# Patient Record
Sex: Female | Born: 1980 | Race: White | Hispanic: No | Marital: Married | Smoking: Never smoker
Health system: Southern US, Community
[De-identification: ages and names within clinical notes are randomized; demographics above are authoritative.]

## PROBLEM LIST (undated history)

## (undated) DIAGNOSIS — Z789 Other specified health status: Secondary | ICD-10-CM

## (undated) DIAGNOSIS — M1990 Unspecified osteoarthritis, unspecified site: Secondary | ICD-10-CM

## (undated) DIAGNOSIS — O42913 Preterm premature rupture of membranes, unspecified as to length of time between rupture and onset of labor, third trimester: Secondary | ICD-10-CM

## (undated) DIAGNOSIS — D62 Acute posthemorrhagic anemia: Secondary | ICD-10-CM

## (undated) HISTORY — DX: Unspecified osteoarthritis, unspecified site: M19.90

## (undated) SURGERY — Surgical Case
Anesthesia: Regional

---

## 2010-08-18 ENCOUNTER — Ambulatory Visit (HOSPITAL_COMMUNITY)
Admission: RE | Admit: 2010-08-18 | Discharge: 2010-08-18 | Payer: Self-pay | Source: Home / Self Care | Attending: Obstetrics | Admitting: Obstetrics

## 2010-10-06 LAB — HEPATITIS B SURFACE ANTIGEN: Hepatitis B Surface Ag: NEGATIVE

## 2010-10-06 LAB — HIV ANTIBODY (ROUTINE TESTING W REFLEX): HIV: NONREACTIVE

## 2010-10-14 LAB — GC/CHLAMYDIA PROBE AMP, GENITAL: Gonorrhea: NEGATIVE

## 2010-10-19 ENCOUNTER — Ambulatory Visit (HOSPITAL_COMMUNITY): Payer: Managed Care, Other (non HMO)

## 2011-02-24 LAB — RPR: RPR: NONREACTIVE

## 2011-04-11 DIAGNOSIS — Z09 Encounter for follow-up examination after completed treatment for conditions other than malignant neoplasm: Secondary | ICD-10-CM

## 2011-05-12 ENCOUNTER — Inpatient Hospital Stay (HOSPITAL_COMMUNITY)
Admission: AD | Admit: 2011-05-12 | Discharge: 2011-05-16 | DRG: 765 | Disposition: A | Discharge status: Home / Self Care | Payer: Managed Care, Other (non HMO) | Source: Ambulatory Visit | Attending: Obstetrics & Gynecology | Admitting: Obstetrics & Gynecology

## 2011-05-12 ENCOUNTER — Encounter (HOSPITAL_COMMUNITY): Payer: Self-pay | Admitting: *Deleted

## 2011-05-12 DIAGNOSIS — O9903 Anemia complicating the puerperium: Secondary | ICD-10-CM | POA: Diagnosis not present

## 2011-05-12 DIAGNOSIS — O324XX0 Maternal care for high head at term, not applicable or unspecified: Secondary | ICD-10-CM | POA: Diagnosis present

## 2011-05-12 DIAGNOSIS — D62 Acute posthemorrhagic anemia: Secondary | ICD-10-CM | POA: Diagnosis not present

## 2011-05-12 DIAGNOSIS — IMO0001 Reserved for inherently not codable concepts without codable children: Secondary | ICD-10-CM

## 2011-05-12 HISTORY — DX: Acute posthemorrhagic anemia: D62

## 2011-05-12 HISTORY — DX: Other specified health status: Z78.9

## 2011-05-12 NOTE — Progress Notes (Signed)
Pt states," I've had contractions off and on all day, but regular since 5:30 pm. I was 3.5 cm in the office yesterday and Dr. Prudencio Pair stripped my membranes. Now they are every 3-5 min."

## 2011-05-13 ENCOUNTER — Encounter (HOSPITAL_COMMUNITY): Payer: Self-pay | Admitting: Anesthesiology

## 2011-05-13 ENCOUNTER — Encounter (HOSPITAL_COMMUNITY)
Admission: AD | Disposition: A | Discharge status: Home / Self Care | Payer: Self-pay | Source: Ambulatory Visit | Attending: Obstetrics & Gynecology

## 2011-05-13 ENCOUNTER — Encounter (HOSPITAL_COMMUNITY): Payer: Self-pay | Admitting: Obstetrics & Gynecology

## 2011-05-13 ENCOUNTER — Inpatient Hospital Stay (HOSPITAL_COMMUNITY): Payer: Managed Care, Other (non HMO) | Admitting: Anesthesiology

## 2011-05-13 LAB — CBC
HCT: 33.8 % — ABNORMAL LOW (ref 36.0–46.0)
Hemoglobin: 11.6 g/dL — ABNORMAL LOW (ref 12.0–15.0)
MCH: 31.7 pg (ref 26.0–34.0)
MCHC: 34.3 g/dL (ref 30.0–36.0)
MCV: 92.3 fL (ref 78.0–100.0)
Platelets: 140 10*3/uL — ABNORMAL LOW (ref 150–400)
RBC: 3.66 MIL/uL — ABNORMAL LOW (ref 3.87–5.11)
RDW: 13.2 % (ref 11.5–15.5)
WBC: 20.8 10*3/uL — ABNORMAL HIGH (ref 4.0–10.5)

## 2011-05-13 SURGERY — Surgical Case
Anesthesia: Regional | Site: Abdomen | Wound class: Clean Contaminated

## 2011-05-13 MED ORDER — DIPHENHYDRAMINE HCL 25 MG PO CAPS: 25.0000 mg | ORAL_CAPSULE | Freq: Four times a day (QID) | ORAL | Status: DC | PRN

## 2011-05-13 MED ORDER — EPHEDRINE 5 MG/ML INJ
10.0000 mg | INTRAVENOUS | Status: DC | PRN
  Filled 2011-05-13: qty 4

## 2011-05-13 MED ORDER — ONDANSETRON HCL 4 MG/2ML IJ SOLN: 4.0000 mg | Freq: Three times a day (TID) | INTRAMUSCULAR | Status: DC | PRN

## 2011-05-13 MED ORDER — PHENYLEPHRINE 40 MCG/ML (10ML) SYRINGE FOR IV PUSH (FOR BLOOD PRESSURE SUPPORT)
PREFILLED_SYRINGE | INTRAVENOUS | Status: AC
  Filled 2011-05-13: qty 5

## 2011-05-13 MED ORDER — METOCLOPRAMIDE HCL 5 MG/ML IJ SOLN: 10.0000 mg | Freq: Once | INTRAMUSCULAR | Status: DC | PRN

## 2011-05-13 MED ORDER — OXYCODONE-ACETAMINOPHEN 5-325 MG PO TABS: 2.0000 | ORAL_TABLET | ORAL | Status: DC | PRN

## 2011-05-13 MED ORDER — ONDANSETRON HCL 4 MG/2ML IJ SOLN: 4.0000 mg | INTRAMUSCULAR | Status: DC | PRN

## 2011-05-13 MED ORDER — NALBUPHINE SYRINGE 5 MG/0.5 ML
5.0000 mg | INJECTION | INTRAMUSCULAR | Status: DC | PRN
  Filled 2011-05-13: qty 1

## 2011-05-13 MED ORDER — ONDANSETRON HCL 4 MG/2ML IJ SOLN
INTRAMUSCULAR | Status: AC
  Filled 2011-05-13: qty 2

## 2011-05-13 MED ORDER — IBUPROFEN 600 MG PO TABS: 600.0000 mg | ORAL_TABLET | Freq: Four times a day (QID) | ORAL | Status: DC | PRN

## 2011-05-13 MED ORDER — WITCH HAZEL-GLYCERIN EX PADS
1.0000 | MEDICATED_PAD | CUTANEOUS | Status: DC | PRN
Start: 2011-05-13 — End: 2011-05-16

## 2011-05-13 MED ORDER — MORPHINE SULFATE 0.5 MG/ML IJ SOLN
INTRAMUSCULAR | Status: AC
  Filled 2011-05-13: qty 10

## 2011-05-13 MED ORDER — ACETAMINOPHEN 325 MG PO TABS
650.0000 mg | ORAL_TABLET | ORAL | Status: DC | PRN
  Administered 2011-05-13: 650 mg via ORAL
  Filled 2011-05-13: qty 2

## 2011-05-13 MED ORDER — TETANUS-DIPHTH-ACELL PERTUSSIS 5-2.5-18.5 LF-MCG/0.5 IM SUSP
0.5000 mL | Freq: Once | INTRAMUSCULAR | Status: AC
  Administered 2011-05-14: 0.5 mL via INTRAMUSCULAR
  Filled 2011-05-13: qty 0.5

## 2011-05-13 MED ORDER — LACTATED RINGERS IV SOLN: 500.0000 mL | Freq: Once | INTRAVENOUS | Status: DC

## 2011-05-13 MED ORDER — SENNOSIDES-DOCUSATE SODIUM 8.6-50 MG PO TABS
2.0000 | ORAL_TABLET | Freq: Every day | ORAL | Status: DC
  Administered 2011-05-14 – 2011-05-15 (×2): 2 via ORAL

## 2011-05-13 MED ORDER — DIBUCAINE 1 % RE OINT
1.0000 | TOPICAL_OINTMENT | RECTAL | Status: DC | PRN
Start: 2011-05-13 — End: 2011-05-16

## 2011-05-13 MED ORDER — ONDANSETRON HCL 4 MG/2ML IJ SOLN
INTRAMUSCULAR | Status: DC | PRN
  Administered 2011-05-13: 4 mg via INTRAVENOUS

## 2011-05-13 MED ORDER — EPHEDRINE 5 MG/ML INJ
INTRAVENOUS | Status: AC
  Filled 2011-05-13: qty 10

## 2011-05-13 MED ORDER — FENTANYL CITRATE 0.05 MG/ML IJ SOLN
INTRAMUSCULAR | Status: DC | PRN
  Administered 2011-05-13: 100 ug via INTRAVENOUS

## 2011-05-13 MED ORDER — NALOXONE HCL 0.4 MG/ML IJ SOLN: 0.4000 mg | INTRAMUSCULAR | Status: DC | PRN

## 2011-05-13 MED ORDER — OXYTOCIN 10 UNIT/ML IJ SOLN
20.0000 [IU] | INTRAVENOUS | Status: DC | PRN
  Administered 2011-05-13: 20 [IU] via INTRAVENOUS

## 2011-05-13 MED ORDER — CEFAZOLIN SODIUM 1-5 GM-% IV SOLN
1.0000 g | INTRAVENOUS | Status: DC
  Filled 2011-05-13: qty 50

## 2011-05-13 MED ORDER — SODIUM BICARBONATE 8.4 % IV SOLN
INTRAVENOUS | Status: DC | PRN
  Administered 2011-05-13: 5 mL via EPIDURAL

## 2011-05-13 MED ORDER — PHENYLEPHRINE 40 MCG/ML (10ML) SYRINGE FOR IV PUSH (FOR BLOOD PRESSURE SUPPORT)
80.0000 ug | PREFILLED_SYRINGE | INTRAVENOUS | Status: DC | PRN
  Filled 2011-05-13: qty 5

## 2011-05-13 MED ORDER — OXYTOCIN 20 UNITS IN LACTATED RINGERS INFUSION - SIMPLE: 1.0000 m[IU]/min | INTRAVENOUS | Status: DC

## 2011-05-13 MED ORDER — CEFAZOLIN SODIUM 1-5 GM-% IV SOLN
INTRAVENOUS | Status: DC | PRN
  Administered 2011-05-13: 1 g via INTRAVENOUS

## 2011-05-13 MED ORDER — PRENATAL PLUS 27-1 MG PO TABS
1.0000 | ORAL_TABLET | Freq: Every day | ORAL | Status: DC
  Administered 2011-05-14 – 2011-05-16 (×3): 1 via ORAL
  Filled 2011-05-13 (×3): qty 1

## 2011-05-13 MED ORDER — FENTANYL CITRATE 0.05 MG/ML IJ SOLN: 25.0000 ug | INTRAMUSCULAR | Status: DC | PRN

## 2011-05-13 MED ORDER — PHENYLEPHRINE HCL 10 MG/ML IJ SOLN
INTRAMUSCULAR | Status: DC | PRN
  Administered 2011-05-13 (×2): 80 ug via INTRAVENOUS
  Administered 2011-05-13 (×3): 120 ug via INTRAVENOUS
  Administered 2011-05-13: 80 ug via INTRAVENOUS

## 2011-05-13 MED ORDER — MENTHOL 3 MG MT LOZG: 1.0000 | LOZENGE | OROMUCOSAL | Status: DC | PRN

## 2011-05-13 MED ORDER — ONDANSETRON HCL 4 MG/2ML IJ SOLN: 4.0000 mg | Freq: Four times a day (QID) | INTRAMUSCULAR | Status: DC | PRN

## 2011-05-13 MED ORDER — LACTATED RINGERS IV SOLN
500.0000 mL | INTRAVENOUS | Status: DC | PRN
  Administered 2011-05-13: 500 mL via INTRAVENOUS

## 2011-05-13 MED ORDER — SODIUM CHLORIDE 0.9 % IJ SOLN
3.0000 mL | INTRAMUSCULAR | Status: DC | PRN
  Administered 2011-05-15: 3 mL via INTRAVENOUS

## 2011-05-13 MED ORDER — FENTANYL CITRATE 0.05 MG/ML IJ SOLN
INTRAMUSCULAR | Status: AC
  Filled 2011-05-13: qty 2

## 2011-05-13 MED ORDER — IBUPROFEN 600 MG PO TABS
600.0000 mg | ORAL_TABLET | Freq: Four times a day (QID) | ORAL | Status: DC
  Administered 2011-05-14 – 2011-05-16 (×10): 600 mg via ORAL
  Filled 2011-05-13 (×4): qty 1

## 2011-05-13 MED ORDER — DIPHENHYDRAMINE HCL 50 MG/ML IJ SOLN: 12.5000 mg | INTRAMUSCULAR | Status: DC | PRN

## 2011-05-13 MED ORDER — SCOPOLAMINE 1 MG/3DAYS TD PT72
1.0000 | MEDICATED_PATCH | Freq: Once | TRANSDERMAL | Status: AC
  Administered 2011-05-13: 1.5 mg via TRANSDERMAL

## 2011-05-13 MED ORDER — ONDANSETRON HCL 4 MG PO TABS: 4.0000 mg | ORAL_TABLET | ORAL | Status: DC | PRN

## 2011-05-13 MED ORDER — TERBUTALINE SULFATE 1 MG/ML IJ SOLN: 0.2500 mg | Freq: Once | INTRAMUSCULAR | Status: DC | PRN

## 2011-05-13 MED ORDER — FENTANYL 2.5 MCG/ML BUPIVACAINE 1/10 % EPIDURAL INFUSION (WH - ANES)
14.0000 mL/h | INTRAMUSCULAR | Status: DC
  Administered 2011-05-13 (×2): 14 mL/h via EPIDURAL
  Filled 2011-05-13 (×3): qty 60

## 2011-05-13 MED ORDER — MORPHINE SULFATE (PF) 0.5 MG/ML IJ SOLN
INTRAMUSCULAR | Status: DC | PRN
  Administered 2011-05-13: 1 mg via INTRAVENOUS

## 2011-05-13 MED ORDER — IBUPROFEN 600 MG PO TABS
600.0000 mg | ORAL_TABLET | Freq: Four times a day (QID) | ORAL | Status: DC | PRN
  Filled 2011-05-13 (×6): qty 1

## 2011-05-13 MED ORDER — DIPHENHYDRAMINE HCL 50 MG/ML IJ SOLN: 25.0000 mg | INTRAMUSCULAR | Status: DC | PRN

## 2011-05-13 MED ORDER — LIDOCAINE HCL 1.5 % IJ SOLN
INTRAMUSCULAR | Status: DC | PRN
  Administered 2011-05-13 (×2): 5 mL via EPIDURAL

## 2011-05-13 MED ORDER — EPHEDRINE SULFATE 50 MG/ML IJ SOLN
INTRAMUSCULAR | Status: DC | PRN
  Administered 2011-05-13: 10 mg via INTRAVENOUS
  Administered 2011-05-13: 5 mg via INTRAVENOUS

## 2011-05-13 MED ORDER — SODIUM CHLORIDE 0.9 % IV SOLN
1.0000 ug/kg/h | INTRAVENOUS | Status: DC | PRN
  Filled 2011-05-13: qty 2.5

## 2011-05-13 MED ORDER — OXYTOCIN 10 UNIT/ML IJ SOLN
INTRAMUSCULAR | Status: AC
  Filled 2011-05-13: qty 4

## 2011-05-13 MED ORDER — PHENYLEPHRINE 40 MCG/ML (10ML) SYRINGE FOR IV PUSH (FOR BLOOD PRESSURE SUPPORT)
PREFILLED_SYRINGE | INTRAVENOUS | Status: AC
  Filled 2011-05-13: qty 10

## 2011-05-13 MED ORDER — DIPHENHYDRAMINE HCL 25 MG PO CAPS: 25.0000 mg | ORAL_CAPSULE | ORAL | Status: DC | PRN

## 2011-05-13 MED ORDER — LACTATED RINGERS IV SOLN
INTRAVENOUS | Status: DC
  Administered 2011-05-13 (×5): via INTRAVENOUS

## 2011-05-13 MED ORDER — SODIUM CHLORIDE 0.9 % IR SOLN
Status: DC | PRN
  Administered 2011-05-13: 1000 mL

## 2011-05-13 MED ORDER — PHENYLEPHRINE 40 MCG/ML (10ML) SYRINGE FOR IV PUSH (FOR BLOOD PRESSURE SUPPORT)
80.0000 ug | PREFILLED_SYRINGE | INTRAVENOUS | Status: DC | PRN
  Filled 2011-05-13 (×2): qty 5

## 2011-05-13 MED ORDER — KETOROLAC TROMETHAMINE 30 MG/ML IJ SOLN
30.0000 mg | Freq: Four times a day (QID) | INTRAMUSCULAR | Status: AC | PRN
  Administered 2011-05-13: 30 mg via INTRAVENOUS
  Filled 2011-05-13: qty 1

## 2011-05-13 MED ORDER — OXYTOCIN 20 UNITS IN LACTATED RINGERS INFUSION - SIMPLE: 125.0000 mL/h | Freq: Once | INTRAVENOUS | Status: DC

## 2011-05-13 MED ORDER — OXYCODONE-ACETAMINOPHEN 5-325 MG PO TABS
1.0000 | ORAL_TABLET | ORAL | Status: DC | PRN
  Administered 2011-05-14 – 2011-05-16 (×11): 1 via ORAL
  Filled 2011-05-13 (×10): qty 1
  Filled 2011-05-13: qty 2
  Filled 2011-05-13: qty 1

## 2011-05-13 MED ORDER — OXYTOCIN BOLUS FROM INFUSION
500.0000 mL | Freq: Once | INTRAVENOUS | Status: DC
  Filled 2011-05-13: qty 500
  Filled 2011-05-13: qty 1000

## 2011-05-13 MED ORDER — MORPHINE SULFATE (PF) 0.5 MG/ML IJ SOLN
INTRAMUSCULAR | Status: DC | PRN
  Administered 2011-05-13: 4 mg via EPIDURAL

## 2011-05-13 MED ORDER — LANOLIN HYDROUS EX OINT: 1.0000 "application " | TOPICAL_OINTMENT | CUTANEOUS | Status: DC | PRN

## 2011-05-13 MED ORDER — CITRIC ACID-SODIUM CITRATE 334-500 MG/5ML PO SOLN
30.0000 mL | ORAL | Status: DC | PRN
  Administered 2011-05-13: 30 mL via ORAL
  Filled 2011-05-13: qty 15

## 2011-05-13 MED ORDER — FLEET ENEMA 7-19 GM/118ML RE ENEM: 1.0000 | ENEMA | RECTAL | Status: DC | PRN

## 2011-05-13 MED ORDER — KETOROLAC TROMETHAMINE 30 MG/ML IJ SOLN: 30.0000 mg | Freq: Four times a day (QID) | INTRAMUSCULAR | Status: AC | PRN

## 2011-05-13 MED ORDER — KETOROLAC TROMETHAMINE 60 MG/2ML IM SOLN
60.0000 mg | Freq: Once | INTRAMUSCULAR | Status: AC | PRN
  Administered 2011-05-13: 60 mg via INTRAMUSCULAR

## 2011-05-13 MED ORDER — SCOPOLAMINE 1 MG/3DAYS TD PT72
MEDICATED_PATCH | TRANSDERMAL | Status: AC
  Administered 2011-05-13: 1.5 mg via TRANSDERMAL
  Filled 2011-05-13: qty 1

## 2011-05-13 MED ORDER — METOCLOPRAMIDE HCL 5 MG/ML IJ SOLN: 10.0000 mg | Freq: Three times a day (TID) | INTRAMUSCULAR | Status: DC | PRN

## 2011-05-13 MED ORDER — MEPERIDINE HCL 25 MG/ML IJ SOLN: 6.2500 mg | INTRAMUSCULAR | Status: DC | PRN

## 2011-05-13 MED ORDER — KETOROLAC TROMETHAMINE 60 MG/2ML IM SOLN
INTRAMUSCULAR | Status: AC
  Administered 2011-05-13: 60 mg via INTRAMUSCULAR
  Filled 2011-05-13: qty 2

## 2011-05-13 MED ORDER — ZOLPIDEM TARTRATE 5 MG PO TABS: 5.0000 mg | ORAL_TABLET | Freq: Every evening | ORAL | Status: DC | PRN

## 2011-05-13 MED ORDER — EPHEDRINE 5 MG/ML INJ
10.0000 mg | INTRAVENOUS | Status: DC | PRN
  Administered 2011-05-13: 10 mg via INTRAVENOUS
  Filled 2011-05-13 (×2): qty 4

## 2011-05-13 MED ORDER — LACTATED RINGERS IV SOLN: INTRAVENOUS | Status: DC

## 2011-05-13 MED ORDER — LIDOCAINE HCL (PF) 1 % IJ SOLN
30.0000 mL | INTRAMUSCULAR | Status: DC | PRN
  Filled 2011-05-13 (×2): qty 30

## 2011-05-13 MED ORDER — OXYTOCIN 20 UNITS IN LACTATED RINGERS INFUSION - SIMPLE: 125.0000 mL/h | INTRAVENOUS | Status: DC

## 2011-05-13 MED ORDER — SIMETHICONE 80 MG PO CHEW
80.0000 mg | CHEWABLE_TABLET | ORAL | Status: DC | PRN
  Administered 2011-05-14 – 2011-05-15 (×6): 80 mg via ORAL

## 2011-05-13 MED ORDER — FENTANYL 2.5 MCG/ML BUPIVACAINE 1/10 % EPIDURAL INFUSION (WH - ANES)
INTRAMUSCULAR | Status: DC | PRN
  Administered 2011-05-13: 14 mL/h via EPIDURAL

## 2011-05-13 SURGICAL SUPPLY — 42 items
BENZOIN TINCTURE PRP APPL 2/3 (GAUZE/BANDAGES/DRESSINGS) IMPLANT
CHLORAPREP W/TINT 26ML (MISCELLANEOUS) ×2 IMPLANT
CLOTH BEACON ORANGE TIMEOUT ST (SAFETY) ×2 IMPLANT
CONTAINER PREFILL 10% NBF 15ML (MISCELLANEOUS) IMPLANT
DRESSING TELFA 8X3 (GAUZE/BANDAGES/DRESSINGS) IMPLANT
DRSG COVADERM 4X10 (GAUZE/BANDAGES/DRESSINGS) ×2 IMPLANT
DRSG PAD ABDOMINAL 8X10 ST (GAUZE/BANDAGES/DRESSINGS) ×2 IMPLANT
ELECT REM PT RETURN 9FT ADLT (ELECTROSURGICAL) ×2
ELECTRODE REM PT RTRN 9FT ADLT (ELECTROSURGICAL) ×1 IMPLANT
EXTRACTOR VACUUM KIWI (MISCELLANEOUS) IMPLANT
EXTRACTOR VACUUM M CUP 4 TUBE (SUCTIONS) IMPLANT
GAUZE SPONGE 4X4 12PLY STRL LF (GAUZE/BANDAGES/DRESSINGS) ×2 IMPLANT
GLOVE BIO SURGEON STRL SZ7 (GLOVE) ×2 IMPLANT
GLOVE BIOGEL PI IND STRL 7.0 (GLOVE) ×4 IMPLANT
GLOVE BIOGEL PI INDICATOR 7.0 (GLOVE) ×4
GLOVE SKINSENSE NS SZ6.5 (GLOVE) ×2
GLOVE SKINSENSE STRL SZ6.5 (GLOVE) ×2 IMPLANT
GOWN PREVENTION PLUS LG XLONG (DISPOSABLE) ×6 IMPLANT
KIT ABG SYR 3ML LUER SLIP (SYRINGE) IMPLANT
NEEDLE HYPO 25X5/8 SAFETYGLIDE (NEEDLE) IMPLANT
NS IRRIG 1000ML POUR BTL (IV SOLUTION) ×2 IMPLANT
PACK C SECTION WH (CUSTOM PROCEDURE TRAY) ×2 IMPLANT
PAD ABD 7.5X8 STRL (GAUZE/BANDAGES/DRESSINGS) ×2 IMPLANT
RTRCTR C-SECT PINK 25CM LRG (MISCELLANEOUS) IMPLANT
SLEEVE SCD COMPRESS KNEE MED (MISCELLANEOUS) ×2 IMPLANT
STAPLER VISISTAT 35W (STAPLE) ×2 IMPLANT
STRIP CLOSURE SKIN 1/4X4 (GAUZE/BANDAGES/DRESSINGS) IMPLANT
SUT MNCRL 0 VIOLET CTX 36 (SUTURE) ×3 IMPLANT
SUT MONOCRYL 0 CTX 36 (SUTURE) ×3
SUT PLAIN 0 NONE (SUTURE) IMPLANT
SUT PLAIN 2 0 (SUTURE)
SUT PLAIN ABS 2-0 CT1 27XMFL (SUTURE) IMPLANT
SUT VIC AB 0 CT1 27 (SUTURE) ×2
SUT VIC AB 0 CT1 27XBRD ANBCTR (SUTURE) ×2 IMPLANT
SUT VIC AB 2-0 CT1 27 (SUTURE) ×2
SUT VIC AB 2-0 CT1 TAPERPNT 27 (SUTURE) ×2 IMPLANT
SUT VIC AB 4-0 KS 27 (SUTURE) IMPLANT
SUT VICRYL 0 TIES 12 18 (SUTURE) IMPLANT
TAPE CLOTH SURG 4X10 WHT LF (GAUZE/BANDAGES/DRESSINGS) ×2 IMPLANT
TOWEL OR 17X24 6PK STRL BLUE (TOWEL DISPOSABLE) ×2 IMPLANT
TRAY FOLEY CATH 14FR (SET/KITS/TRAYS/PACK) IMPLANT
WATER STERILE IRR 1000ML POUR (IV SOLUTION) ×2 IMPLANT

## 2011-05-13 NOTE — H&P (Signed)
Kaitlyn Daniels is a 30 y.o. female presenting for active labor. Was having q 3 min contractions, bloody show and was 4 cm dilated at admission. Good FMs. No leaking fluid. Pregnancy following infertility treatment. Uncomplicated course. PNCare at [facility] since 7 wks. ROS- no HAs/vision probl/ RUQ pain/LE swelling/pain.   OB History    Grav Para Term Preterm Abortions TAB SAB Ect Mult Living   1              Past Medical History  Diagnosis Date  . No pertinent past medical history    Past Surgical History  Procedure Date  . No past surgeries    Family History: family history is not on file. Social History:  reports that she has never smoked. She does not have any smokeless tobacco history on file. She reports that she does not drink alcohol or use illicit drugs.  Physical exam:  A&O x 3, no acute distress. Pleasant HEENT neg, no thyromegaly Lungs CTA bilat CV RRR, A1S2 normal Abdo soft, non tender, non acute Extr no edema/ tenderness Pelvic 4-5 cm/ 90%/ Vtx/-2. AROM, clear.  FHT  130s/ + accels/ no decels/ moderate variability- category I Toco irreg q 3-5 min. Watch pattern.   Dilation: 4.5 Effacement (%): 90 Station: -2 Exam by:: a. white rn Blood pressure 101/67, pulse 82, temperature 99 F (37.2 C), temperature source Oral, resp. rate 18, height 5\' 3"  (1.6 m), weight 73.143 kg (161 lb 4 oz).  Prenatal labs: ABO, Rh: A/Positive/-- (02/02 0000) Antibody: Negative (02/02 0000) Rubella:  Immune RPR: NON REACTIVE (09/08 0025)  HBsAg: Negative (02/02 0000)  HIV: Non-reactive (02/02 0000)  GBS: Negative (08/13 0000)  1 hr Glucola- abn, 3 hr normal.  Genetic screening Normal Anatomy US normal  Assessment/Plan: G1 at 39+ wks, active labor, but UCs have spaced out.  S/p Epidural, assess UCs since AROM, if needed will augment with low dose pitocin.  FWB- reassuring.   Hanan Moen R 05/13/2011, 7:25 AM

## 2011-05-13 NOTE — OR Nursing (Signed)
Foley catheter in place upon arrival to OR. Urine color-clear. Uterus massaged by S. Lisette Mancebo Charity fundraiser. Two tubes of cord blood to lab.

## 2011-05-13 NOTE — Anesthesia Postprocedure Evaluation (Signed)
  Anesthesia Post-op Note  Patient: Kaitlyn Daniels  Procedure(s) Performed:  CESAREAN SECTION - Primary cesarean section with delivery of baby boy at 44. Apgars 4/8.  Patient Location: PACU  Anesthesia Type: Epidural  Level of Consciousness: awake, alert  and oriented  Airway and Oxygen Therapy: Patient Spontanous Breathing  Post-op Pain: none  Post-op Assessment: Post-op Vital signs reviewed, Patient's Cardiovascular Status Stable, Respiratory Function Stable, Patent Airway, No signs of Nausea or vomiting, Pain level controlled, No headache and No backache  Post-op Vital Signs: Reviewed and stable  Complications: No apparent anesthesia complications

## 2011-05-13 NOTE — Transfer of Care (Signed)
Immediate Anesthesia Transfer of Care Note  Patient: Kaitlyn Daniels  Procedure(s) Performed:  CESAREAN SECTION - Primary cesarean section with delivery of baby boy at 28. Apgars 4/8.  Patient Location: PACU  Anesthesia Type: Epidural  Level of Consciousness: awake, alert  and oriented  Airway & Oxygen Therapy: Patient Spontanous Breathing  Post-op Assessment: Report given to PACU RN and Post -op Vital signs reviewed and stable  Post vital signs: stable  Complications: No apparent anesthesia complications

## 2011-05-13 NOTE — Anesthesia Preprocedure Evaluation (Addendum)
Anesthesia Evaluation  Name, MR# and DOB Patient awake  General Assessment Comment  Reviewed: Allergy & Precautions, H&P , NPO status , Patient's Chart, lab work & pertinent test results  Airway Mallampati: I TM Distance: >3 FB Neck ROM: full    Dental No notable dental hx. (+) Teeth Intact   Pulmonary  clear to auscultation  pulmonary exam normalPulmonary Exam Normal breath sounds clear to auscultation none    Cardiovascular     Neuro/Psych Negative Neurological ROS  Negative Psych ROS  GI/Hepatic/Renal negative GI ROS  negative Liver ROS  negative Renal ROS        Endo/Other  Negative Endocrine ROS (+)      Abdominal Normal abdominal exam  (+)   Musculoskeletal negative musculoskeletal ROS (+)   Hematology negative hematology ROS (+)   Peds  Reproductive/Obstetrics (+) Pregnancy    Anesthesia Other Findings             Anesthesia Physical Anesthesia Plan  ASA: II and Emergent  Anesthesia Plan: Epidural   Post-op Pain Management:    Induction:   Airway Management Planned:   Additional Equipment:   Intra-op Plan:   Post-operative Plan:   Informed Consent: I have reviewed the patients History and Physical, chart, labs and discussed the procedure including the risks, benefits and alternatives for the proposed anesthesia with the patient or authorized representative who has indicated his/her understanding and acceptance.     Plan Discussed with:   Anesthesia Plan Comments:        Anesthesia Quick Evaluation

## 2011-05-13 NOTE — Anesthesia Procedure Notes (Signed)
Epidural Patient location during procedure: OB Start time: 05/13/2011 2:42 AM End time: 05/13/2011 2:51 AM Reason for block: procedure for pain  Staffing Anesthesiologist: Sandrea Hughs Performed by: anesthesiologist   Preanesthetic Checklist Completed: patient identified, site marked, surgical consent, pre-op evaluation, timeout performed, IV checked, risks and benefits discussed and monitors and equipment checked  Epidural Patient position: sitting Prep: site prepped and draped and DuraPrep Patient monitoring: continuous pulse ox and blood pressure Approach: midline Injection technique: LOR air  Needle:  Needle type: Tuohy  Needle gauge: 17 G Needle length: 9 cm Needle insertion depth: 4 cm Catheter type: closed end flexible Catheter size: 19 Gauge Catheter at skin depth: 8 cm Test dose: negative and 1.5% lidocaine  Assessment Events: blood not aspirated, injection not painful, no injection resistance, negative IV test and no paresthesia

## 2011-05-13 NOTE — Progress Notes (Signed)
Subjective: Doing well, pain well controlled with epidural.   Objective: BP 115/56  Pulse 93  Temp(Src) 98.7 F (37.1 C) (Tympanic)  Resp 24  Ht 5\' 3"  (1.6 m)  Wt 73.143 kg (161 lb 4 oz)  BMI 28.56 kg/m2   FHT:  FHR: 140 bpm, variability: moderate,  accelerations:  Present,  decelerations:  Absent UC:   regular, every 3 minutes SVE:   Dilation: 10 Effacement (%): 100 Station: 0 Exam by:: Tessah Patchen  Pt was 9 cm at 9 am, then protracted last phase with complete at 12 noon and pushing since then. Brought baby down quite a bit with large caput and significant moulding. But last ne hour there has been no progress of station at all despite excellent pushing and strong spontaneous contractions.  FHT Category I all throughout.   Assessment / Plan: Arrest of decent  Fetal Wellbeing:  Category I Pain Control:  Epidural  Anticipated MOD:  Proceed with Cesarean delivery.  Risks/complications/indications reviewed, pt understands and agrees.   Kaitlyn Daniels R 05/13/2011, 2:34 PM

## 2011-05-13 NOTE — Op Note (Signed)
Cesarean Section Procedure Note : DEYONNA FITZSIMMONS 05/13/2011  Indications: G1 at 39.2/7 wks, arrest of descent after 2+ hrs of pushing, large caput and OP position.    Pre-operative Diagnosis: Term pregnancy, Failure to descend in stage II Post-operative Diagnosis: Same  Surgery:  Primary Low transverse cesarean section.  Surgeon: Robley Fries, MD  Assistants: Marlinda Mike, CNM Anesthesia: Epidural  Findings: Female infant in direct OP position with large caput and tight fit in pelvis. Delivery difficult due to difficulty with head rotation and head flexion.  Baby delivery completed at 15.18 hrs. NICU team in room. Apgars 4 and 8 at 1 and 5 min, cord pH (arterial) 7.30. Baby came out floppy and perked up following bag and mask resuscitation. Bruising and large caput on scalp and right ear and back from difficult delivery.  Baby wt 7lb 2 oz. Loose nuchal cord. Normal placenta, 3 vessel cord. Normal maternal tubes and ovaries.     Estimated Blood Loss: 800 cc Total IV Fluids: 1700 cc LR Urine Output: 100 cc, pink tinged, but clearing at the end of procedure Specimens: Cord gas and cord blood Complications: none Disposition: PACU Maternal Condition: stable Baby condition / location:  Stable, in nursery.   Procedure Details:  The patient was counseled in labor room for primary cesarean delivery once arrest of descent after 2.1/2 hrs of pushing and progression of just caput noted. Indication, risks, benefits, complications, and expected outcomes were discussed with the patient including infection, bleeding, damage to internal organs as well as baby during delivery. The patient concurred with the proposed plan, giving informed consent. identified as [place] [employer] and the procedure verified as Cesarean Section Delivery. She was brought to the OR with IV and epidural running well and foley draining bladder well. She received 1 gm Ancef in OR and A Time Out was held and the above information  confirmed.   Epidural anesthesia was adequate and patient was prepped and draped in the usual sterile manner. A Pfannenstiel incision was made and carried down through the subcutaneous tissue to the fascia. Fascial incision was made and extended transversely. The fascia was separated from the underlying rectus tissue superiorly and inferiorly. The peritoneum was identified and entered. Peritoneal incision was extended longitudinally. The utero-vesical peritoneal reflection was incised transversely and the bladder flap was bluntly freed from the lower uterine segment. A low transverse uterine incision was made.  Female infant was delivered from OP position after multiple attempts of head rotation and flexion and even with push up vaginally by RN. Baby was delivered at 1518 hrs. Umbilical cord was clamped and cut and baby handed off to waiting NICU team. Apgar scores of 4 at one minute and 8 at five minutes. Cord ph was sent, was 7.30 (arterial), cord blood was obtained for evaluation. The placenta was removed Intact and appeared normal. The uterine outline, tubes and ovaries appeared normal}. The uterine incision was closed with running locked sutures of 0 Vicryl in 2 layers. No significant extensions noted.  Hemostasis was observed. Lavage was carried out until clear. Peritoneum was closed with 2-0 Vicryl. The fascia was then reapproximated with running sutures of 0Vicryl. Irrigation done and hemostasis excellent in subcutaneous tissue. The skin was closed with staples. Sterile dressing applied.  Instrument, sponge, and needle counts were correct prior the abdominal closure and were correct at the conclusion of the case. Pecola Leisure is doing well in Nursery.   Shea Evans, MD  Signed: Surgeon(s): Robley Fries

## 2011-05-14 LAB — CBC
Platelets: 128 10*3/uL — ABNORMAL LOW (ref 150–400)
RDW: 13.3 % (ref 11.5–15.5)
WBC: 23.3 10*3/uL — ABNORMAL HIGH (ref 4.0–10.5)

## 2011-05-14 MED ORDER — POLYSACCHARIDE IRON 150 MG PO CAPS
150.0000 mg | ORAL_CAPSULE | Freq: Every day | ORAL | Status: DC
  Administered 2011-05-14 – 2011-05-16 (×3): 150 mg via ORAL
  Filled 2011-05-14 (×4): qty 1

## 2011-05-14 MED ORDER — DOCUSATE SODIUM 100 MG PO CAPS
100.0000 mg | ORAL_CAPSULE | Freq: Two times a day (BID) | ORAL | Status: DC | PRN
  Administered 2011-05-14 – 2011-05-15 (×3): 100 mg via ORAL
  Filled 2011-05-14 (×3): qty 1

## 2011-05-14 NOTE — Progress Notes (Signed)
Encounter addended by: Len Blalock on: 05/14/2011  8:54 PM<BR>     Documentation filed: Notes Section

## 2011-05-14 NOTE — Progress Notes (Signed)
  S:         Reports feeling well this am; a little sore when ambulatory              Tolerating po intake with out nausea /vomiting /  No flatus / no BM             Bleeding is light             Pain controlled withibuprofen (OTC) and narcotic analgesics including Percocet             Up ad lib / ambulatory  Newborn breast feeding feeding  / Circumcision pending   O:  A & O x 3 , NAD, Pleasant affect             VS: Blood pressure 82/45, pulse 86, temperature 98.3 F (36.8 C), temperature source Oral, resp. rate 18, height 5\' 3"  (1.6 m), weight 73.143 kg (161 lb 4 oz), SpO2 97.00%, unknown if currently breastfeeding.  LABS:  Basename 05/14/11 0529 05/13/11 0025  HGB 7.7* 11.6*  HCT 22.3* 33.8*    I&O: I/O last 3 completed shifts: In: 4308.1 [P.O.:720; I.V.:3588.1] Out: 6000 [Urine:5200; Blood:800]   Total I/O In: 360 [P.O.:120; I.V.:240] Out: 400 [Urine:400]  Lungs: Clear and unlabored  Heart: regular rate and rhythm / no mumurs  Abdomen: soft, non-tender, non-distended              Fundus: firm, non-tender, U-1             Dressing Intact and clean  Perineum: intact, slight edem  Lochia: scant rubra  Extremities: no edema, no calf pain or tenderness  A:        POD # 1 S/P LTCS            Acute blood loss anemia  P:        Routine postoperative care              Maintain IV access  Repeat CBC in am  Add iron and colace  Pt notified of lab results and management plan to include possible transfusion with onset of sx and/or decrease in blood count.   Kaitlyn Daniels BSN, RN   Kaitlyn Daniels 05/14/2011, 10:13 AM

## 2011-05-14 NOTE — Anesthesia Postprocedure Evaluation (Signed)
  Anesthesia Post-op Note  Patient: Kaitlyn Daniels  Procedure(s) Performed:  CESAREAN SECTION - Primary cesarean section with delivery of baby boy at 27. Apgars 4/8.  Patient Location: PACU and Women's Unit  Anesthesia Type: Epidural  Level of Consciousness: awake, alert  and oriented  Airway and Oxygen Therapy: Patient Spontanous Breathing   Post-op Assessment: Patient's Cardiovascular Status Stable and Respiratory Function Stable  Post-op Vital Signs: stable  Complications: No apparent anesthesia complications

## 2011-05-14 NOTE — Progress Notes (Signed)
Recheck CBC in AM tomorrow, watch VS and assist with ambulation. Start iron PO bid once able to eat well. Agree with above note and plan.

## 2011-05-15 ENCOUNTER — Encounter (HOSPITAL_COMMUNITY): Payer: Self-pay

## 2011-05-15 DIAGNOSIS — D62 Acute posthemorrhagic anemia: Secondary | ICD-10-CM

## 2011-05-15 HISTORY — DX: Acute posthemorrhagic anemia: D62

## 2011-05-15 LAB — CBC
HCT: 22.9 % — ABNORMAL LOW (ref 36.0–46.0)
Hemoglobin: 7.7 g/dL — ABNORMAL LOW (ref 12.0–15.0)
MCH: 31.6 pg (ref 26.0–34.0)
MCHC: 33.6 g/dL (ref 30.0–36.0)
MCV: 93.9 fL (ref 78.0–100.0)
Platelets: 136 10*3/uL — ABNORMAL LOW (ref 150–400)
RBC: 2.44 MIL/uL — ABNORMAL LOW (ref 3.87–5.11)
RDW: 13.6 % (ref 11.5–15.5)
WBC: 17.7 10*3/uL — ABNORMAL HIGH (ref 4.0–10.5)

## 2011-05-15 NOTE — Progress Notes (Signed)
Subjective: POD# 2 Information for the patient's newborn:  Kaitlyn Daniels, Kaitlyn Daniels [[id number]]  female   / circ pending  Reports feeling well. Feeding: breast Patient reports tolerating PO.  Breast symptoms: none Pain controlled withprescription NSAID's including ibuprofen (Motrin) and narcotic analgesics including Percocet Denies HA/SOB/C/P/N/V/dizziness. Flatus present. She reports vaginal bleeding as normal, without clots.  She is ambulating, urinating without difficult.     Objective:   VS: Blood pressure 118/63, pulse 70, temperature 98.1 F (36.7 C), temperature source Oral, resp. rate 18, height 5\' 3"  (1.6 m), weight 73.143 kg (161 lb 4 oz), SpO2 98.00%, unknown if currently breastfeeding.    Intake/Output Summary (Last 24 hours) at 05/15/11 1135 Last data filed at 05/14/11 1200  Gross per 24 hour  Intake      0 ml  Output    700 ml  Net   -700 ml         Basename 05/15/11 0531 05/14/11 0529  WBC 17.7* 23.3*  HGB 7.7* 7.7*  HCT 22.9* 22.3*  PLT 136* 128*     Blood type: A/Positive/-- (02/02 0000)  Rubella: Immune (02/02 0000)     Physical Exam:  General: alert, cooperative and no distress CV: Regular rate and rhythm Resp: clear Abdomen: soft, nontender, normal bowel sounds Incision: clean, dry, intact and stapels in place. No errythema, ecchymosis, or drainage Uterine Fundus: firm, below umbilicus, nontender Lochia: minimal Ext: edema trace      Assessment/Plan: 30 y.o.  status post Cesarean section. POD# 2.  s/p Cesarean Delivery.  Indications: failure to progress                Active Problems:  Postpartum care following cesarean delivery (9/8)  Acute blood loss anemia Stable H&H, no further drop this AM, asymptomatic Continue oral Fe BID and stool softeners Doing well, stable post-op             Ambulate Routine post-op care Anticipate D/C in AM  PAUL,DANIELA 05/15/2011, 11:35 AM

## 2011-05-16 MED ORDER — POLYSACCHARIDE IRON 150 MG PO CAPS: 150.0000 mg | ORAL_CAPSULE | Freq: Two times a day (BID) | ORAL | Status: DC

## 2011-05-16 MED ORDER — DSS 100 MG PO CAPS: 100.0000 mg | ORAL_CAPSULE | Freq: Two times a day (BID) | ORAL | Status: AC

## 2011-05-16 MED ORDER — OXYCODONE-ACETAMINOPHEN 5-325 MG PO TABS: 1.0000 | ORAL_TABLET | ORAL | Status: AC | PRN

## 2011-05-16 MED ORDER — IBUPROFEN 600 MG PO TABS: 600.0000 mg | ORAL_TABLET | Freq: Four times a day (QID) | ORAL | Status: AC

## 2011-05-16 MED ORDER — INFLUENZA VIRUS VACC SPLIT PF IM SUSP
0.5000 mL | Freq: Once | INTRAMUSCULAR | Status: AC
  Administered 2011-05-16: 0.5 mL via INTRAMUSCULAR
  Filled 2011-05-16: qty 0.5

## 2011-05-16 NOTE — Discharge Summary (Signed)
Patient ID: Kaitlyn Daniels MRN: [id number] DOB/AGE: Jul 08, 1981 30 y.o.  Admit date: 05/12/2011 Discharge date:  05/16/2011  Admission Diagnoses: Term pregnancy   Discharge Diagnoses:  Active Problems:  Postpartum care following cesarean delivery (9/8)  Acute blood loss anemia   Prenatal history:  G1P1001 at [redacted]w[redacted]d with Estimated Date of Delivery: 05/18/11  Prenatal care at [facility] since [redacted] weeks gestation with Dr. Ernestina Penna as primary provider.   Prenatal course complicated by pregnancy following infertility treatment.  Prenatal Labs: ABO, Rh: A/Positive/-- (02/02 0000) Antibody: Negative (02/02 0000) Rubella:  immune RPR: NON REACTIVE (09/08 0025)  HBsAg: Negative (02/02 0000)  HIV: Non-reactive (02/02 0000)  GBS: Negative (08/13 0000)  1 hr Glucola : abn, 3 hr normal   Medical / Surgical History : Past Medical History  Diagnosis Date  . No pertinent past medical history   . Acute blood loss anemia 05/15/2011   Past Surgical History  Procedure Date  . No past surgeries    Social History:  reports that she has never smoked. She does not have any smokeless tobacco history on file. She reports that she does not drink alcohol or use illicit drugs.  Allergies: Review of patient's allergies indicates no known allergies.   Current Medications at time of admission:  Prescriptions prior to admission  Medication Sig Dispense Refill  . prenatal vitamin w/FE, FA (PRENATAL 1 + 1) 27-1 MG TABS Take 1 tablet by mouth daily.            Intrapartum Course: Patient was admitted to hospital in the evening of 05/12/2011 in active labor. She received an epidural for pain control and membranes were ruptured artificially.  Pt was 9 cm at 9 am, then protracted last phase with complete at 12 noon and pushing for next 2 hours. Brought baby down quite a bit with large caput and significant moulding. But last one hour there had been no progress of station at all despite  excellent pushing and strong spontaneous contractions. FHT Category I all throughout. Patient was counseled to proceed with Cesarean Section.  Procedures: Cesarean section delivery of female newborn by Dr Juliene Pina  See operative report for further details  Postoperative / postpartum course:  Complicated by acute blood loss anemia. Hemoglobin levels as follows:  05/13/2011 00:25 05/14/2011 05:29 05/15/2011 05:31  HGB 11.6 (L) 7.7 (L) 7.7 (L)  Patient was started on oral Fe and stool softeners during postpartum. She remained asymptomatic throughout hospital stay.  Physical Exam:  VSS: Blood pressure 109/71, pulse 65, temperature 97.9 F (36.6 C), temperature source Oral, resp. rate 18, height 5\' 3"  (1.6 m), weight 73.143 kg (161 lb 4 oz), SpO2 98.00%,  currently breastfeeding.   LABS:  Lab Results  Component Value Date   HGB 7.7* 05/15/2011                             Lab Results  Component Value Date   PLT 136* 05/15/2011       Incision:  approximated with staples / no erythema / no ecchymosis / no drainage Staples: removed prior to discharge and replaced with benzoin and steri strips.  Discharge Instructions:  Postpartum Instructions: per [facility] Booklet - given to patient  Discharged Condition: good  Diet: Regular diet with adequate water hydration.  Discharge Orders    Future Orders Please Complete By Expires   Diet - low sodium heart healthy  Discharge instructions      Comments:   [facility] instructions booklet    Strep B DNA probe      Comments:   This external order was created through the Results Console.   HIV antibody      Comments:   This external order was created through the Results Console.   GC/chlamydia probe amp, genital      Comments:   This external order was created through the Results Console.   Rubella antibody, IgM      Comments:   This external order was created through the Results Console.   Hepatitis B surface antigen      Comments:   This  external order was created through the Results Console.   RPR      Comments:   This external order was created through the Results Console.   Antibody screen      Comments:   This external order was created through the Results Console.   ABO/Rh      Comments:   This external order was created through the Results Console.     Current Discharge Medication List    START taking these medications   Details  docusate sodium 100 MG CAPS Take 100 mg by mouth 2 (two) times daily. Qty: 10 capsule    ibuprofen (ADVIL,MOTRIN) 600 MG tablet Take 1 tablet (600 mg total) by mouth every 6 (six) hours. Qty: 60 tablet, Refills: 0    oxyCODONE-acetaminophen (PERCOCET) 5-325 MG per tablet Take 1-2 tablets by mouth every 3 (three) hours as needed (moderate - severe pain). Qty: 30 tablet, Refills: 0    polysaccharide iron (NIFEREX) 150 MG CAPS capsule Take 1 capsule (150 mg total) by mouth 2 (two) times daily. Qty: 60 each, Refills: 3      CONTINUE these medications which have NOT CHANGED   Details  prenatal vitamin w/FE, FA (PRENATAL 1 + 1) 27-1 MG TABS Take 1 tablet by mouth daily.         Follow-up Information    Follow up with [facility] in 6 weeks.   Contact information:   [address] [place] [address] [phone]          Disposition: Discharged from inpatient care, will remain rooming in for next 12-24 hours pending infant discharge by pediatrician.  Signed: PAUL,DANIELA 05/16/2011, 10:06 AM

## 2011-05-16 NOTE — Progress Notes (Signed)
  Subjective: POD# 3 Information for the patient's newborn:  Kaitlyn, Daniels [[id number]]  female  / circ done  Reports feeling tired, blurry vision up close Feeding: breast Patient reports tolerating PO.  Breast symptoms: milk coming in Pain controlled withprescription NSAID's including ibuprofen (Motrin) and narcotic analgesics including Percocet Denies HA/SOB/C/P/N/V/dizziness. Flatus present, no BM yet. She reports vaginal bleeding as normal, without clots.  She is ambulating, urinating without difficult.    Infant on phototherapy, not ready for D/C yet.  Objective:   VS: Blood pressure 109/71, pulse 65, temperature 97.9 F (36.6 C), temperature source Oral, resp. rate 18, height 5\' 3"  (1.6 m), weight 73.143 kg (161 lb 4 oz), SpO2 98.00%,  currently breastfeeding.      Basename 05/15/11 0531 05/14/11 0529  WBC 17.7* 23.3*  HGB 7.7* 7.7*  HCT 22.9* 22.3*  PLT 136* 128*     Blood type: A/Positive/-- (02/02 0000)  Rubella: Immune (02/02 0000)     Physical Exam:  General: alert, cooperative, no distress and pale CV: Regular rate and rhythm Resp: clear Abdomen: soft, nontender, normal bowel sounds Incision: clean, dry, intact and no drainage present Uterine Fundus: firm, below umbilicus, nontender Lochia: minimal Ext: edema trace pedal      Assessment/Plan: 30 y.o.  status post Cesarean section. POD# 3.  s/p Cesarean Delivery.  Indications: failure to progress                Active Problems:  Postpartum care following cesarean delivery (9/8)  Acute blood loss anemia on oral Fe and stool softener Doing well, stable.          Routine post-op care D/C from inpatient care, will continue rooming in until infant d/c' ed by peds.  Mercie Balsley 05/16/2011, 9:52 AM

## 2011-05-17 ENCOUNTER — Encounter (HOSPITAL_COMMUNITY): Payer: Self-pay | Admitting: Obstetrics & Gynecology

## 2011-05-18 NOTE — Discharge Summary (Signed)
Agree with note and plan.

## 2011-11-30 ENCOUNTER — Telehealth: Payer: Self-pay | Admitting: Family Medicine

## 2011-11-30 NOTE — Telephone Encounter (Signed)
Please note Tabor[place] instructions

## 2011-11-30 NOTE — Telephone Encounter (Signed)
I spoke w/pt and rescheduled her appt for 12-05-11 @ 11:30. Thank you.

## 2011-11-30 NOTE — Telephone Encounter (Signed)
Please advise 

## 2011-11-30 NOTE — Telephone Encounter (Signed)
She can come at 11:30 on 4/2 (tuesday)

## 2011-11-30 NOTE — Telephone Encounter (Signed)
Sue Lush from [facility] ob/gyn called to schedule this patient with Dr. Beverely Low. She states the pt has a lump in her neck and would like to get in asap. She would be a new patient, and I have scheduled her for 01-04-12. Sue Lush states the patient is concerned about the lump and would like to get the pt in sooner if possible. Can we get her an appt any sooner?

## 2011-11-30 NOTE — Telephone Encounter (Signed)
noted 

## 2011-12-05 ENCOUNTER — Ambulatory Visit (INDEPENDENT_AMBULATORY_CARE_PROVIDER_SITE_OTHER): Payer: 59 | Admitting: Family Medicine

## 2011-12-05 ENCOUNTER — Encounter: Payer: Self-pay | Admitting: Family Medicine

## 2011-12-05 ENCOUNTER — Ambulatory Visit
Admission: RE | Admit: 2011-12-05 | Discharge: 2011-12-05 | Disposition: A | Discharge status: Home / Self Care | Payer: 59 | Source: Ambulatory Visit | Attending: Family Medicine | Admitting: Family Medicine

## 2011-12-05 VITALS — BP 120/77 | HR 78 | Temp 98.8°F | Ht 63.5 in | Wt 137.0 lb

## 2011-12-05 DIAGNOSIS — E010 Iodine-deficiency related diffuse (endemic) goiter: Secondary | ICD-10-CM

## 2011-12-05 DIAGNOSIS — E049 Nontoxic goiter, unspecified: Secondary | ICD-10-CM

## 2011-12-05 NOTE — Patient Instructions (Signed)
We'll notify you of your ultrasound results and determine the next step You are not crazy! Hang in there! Welcome!  We're glad to have you!

## 2011-12-05 NOTE — Progress Notes (Signed)
  Subjective:    Patient ID: Kaitlyn Daniels, female    DOB: October 26, 1980, 31 y.o.   MRN: [id number]  HPI New to establish.  GYN- [facility].  Lump in neck- had normal thyroid level at recent GYN exam.  First noticed 1 month ago.  Not painful.  No trouble w/ swallowing.  Feeling hot at night- this is new thing.  Is losing hair but is 6 months post-partum.  + family hx of thyroid issues (mom)   Review of Systems For ROS see HPI     Objective:   Physical Exam  Vitals reviewed. Constitutional: She appears well-developed and well-nourished. No distress.  Neck: Normal range of motion. Neck supple. Thyromegaly (bilateral thyroid enlargement w/ nodule on R) present.  Cardiovascular: Normal rate, regular rhythm and normal heart sounds.   No murmur heard. Pulmonary/Chest: Effort normal and breath sounds normal. No respiratory distress. She has no wheezes. She has no rales.          Assessment & Plan:

## 2011-12-05 NOTE — Assessment & Plan Note (Signed)
New.  Pt w/ enlarged thyroid bilaterally but also w/ focal nodule on R.  Get [place] to determine whether bx is required.  Recent TSH normal.  Pt expressed understanding and is in agreement w/ plan.

## 2011-12-06 ENCOUNTER — Telehealth: Payer: Self-pay | Admitting: *Deleted

## 2011-12-06 DIAGNOSIS — E041 Nontoxic single thyroid nodule: Secondary | ICD-10-CM

## 2011-12-06 NOTE — Telephone Encounter (Signed)
Pt called left msg on triage vmail requesting her test results.  Best contact # [phone]

## 2011-12-07 NOTE — Telephone Encounter (Signed)
Noted waiver in pt chart signed to allow detailed messages to be left on voicemail, left detailed message about: results/instructions/prescribtion information. Advise if any further concerns or questions please call our office at [phone]. Sent ENT referral, advised pt via vm that someone will call about upcoming apt

## 2011-12-19 ENCOUNTER — Other Ambulatory Visit: Payer: Self-pay | Admitting: Otolaryngology

## 2011-12-19 DIAGNOSIS — E041 Nontoxic single thyroid nodule: Secondary | ICD-10-CM

## 2011-12-26 ENCOUNTER — Other Ambulatory Visit (HOSPITAL_COMMUNITY)
Admission: RE | Admit: 2011-12-26 | Discharge: 2011-12-26 | Disposition: A | Discharge status: Home / Self Care | Payer: 59 | Source: Ambulatory Visit | Attending: Interventional Radiology | Admitting: Interventional Radiology

## 2011-12-26 ENCOUNTER — Ambulatory Visit
Admission: RE | Admit: 2011-12-26 | Discharge: 2011-12-26 | Disposition: A | Discharge status: Home / Self Care | Payer: 59 | Source: Ambulatory Visit | Attending: Otolaryngology | Admitting: Otolaryngology

## 2011-12-26 DIAGNOSIS — E041 Nontoxic single thyroid nodule: Secondary | ICD-10-CM

## 2011-12-26 DIAGNOSIS — E049 Nontoxic goiter, unspecified: Secondary | ICD-10-CM | POA: Insufficient documentation

## 2012-01-04 ENCOUNTER — Ambulatory Visit: Payer: Managed Care, Other (non HMO) | Admitting: Family Medicine

## 2012-04-02 IMAGING — RF DG HYSTEROGRAM
4 series · 4 of 4 positions shown · IV contrast (omnipaque)
Comparison: none

CLINICAL DATA: Primary infertility

HYSTEROSALPINGOGRAM
TECHNIQUE: Following cleansing of the cervix and vagina with
Betadine solution, a hysterosalpingogram was performed using a 5-
French hysterosalpingogram catheter and Omnipaque 300 contrast.
The patient tolerated the examination without difficulty.
Fluoroscopy time: 0.4 minutes.

[Series 1: run · 1 of 1 slices shown (1 of 4)]
[im 1/1]
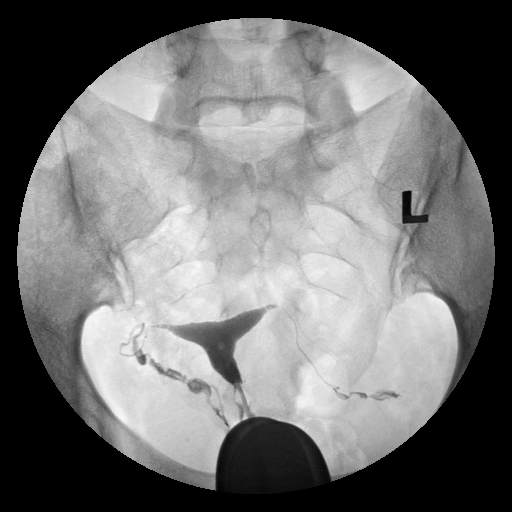

[Series 2: run · 1 of 1 slices shown (2 of 4)]
[im 1/1]
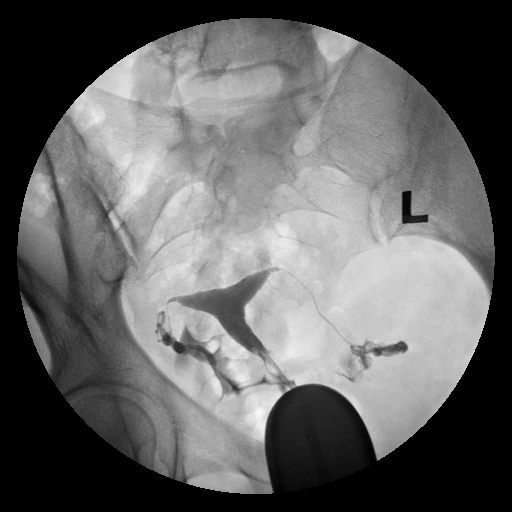

[Series 3: run · 1 of 1 slices shown (3 of 4)]
[im 1/1]
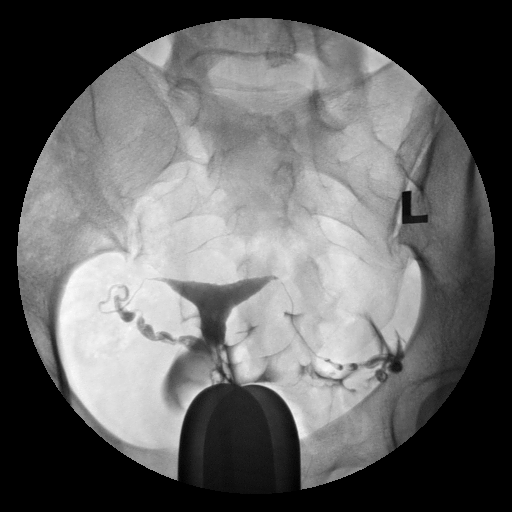

[Series 4: run · 1 of 1 slices shown (4 of 4)]
[im 1/1]
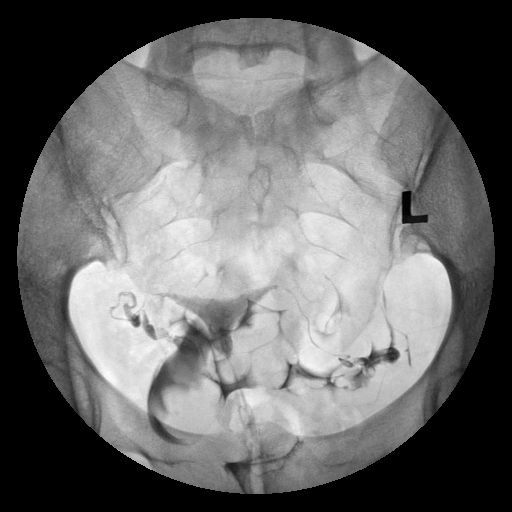

[4 of 4 positions shown; findings below may reference images not displayed]

FINDINGS: A normal endometrial morphology is seen.  Both fallopian
tubes have a normal morphology and bilateral free intraperitoneal
spill is noted.  No evidence for loculation of contrast is seen
within the pelvis to suggest the presence of peritubal or
periovarian adhesions.
IMPRESSION: Normal HSG.

Approximately 4 ml of Omnipaque 300% was utilized for this exam.

## 2012-07-10 ENCOUNTER — Other Ambulatory Visit: Payer: Self-pay | Admitting: Otolaryngology

## 2012-07-10 DIAGNOSIS — D34 Benign neoplasm of thyroid gland: Secondary | ICD-10-CM

## 2012-07-16 ENCOUNTER — Ambulatory Visit
Admission: RE | Admit: 2012-07-16 | Discharge: 2012-07-16 | Disposition: A | Discharge status: Home / Self Care | Payer: 59 | Source: Ambulatory Visit | Attending: Otolaryngology | Admitting: Otolaryngology

## 2012-07-16 DIAGNOSIS — D34 Benign neoplasm of thyroid gland: Secondary | ICD-10-CM

## 2012-09-30 LAB — OB RESULTS CONSOLE HIV ANTIBODY (ROUTINE TESTING): HIV: NONREACTIVE

## 2012-09-30 LAB — OB RESULTS CONSOLE RPR: RPR: NONREACTIVE

## 2013-03-11 ENCOUNTER — Other Ambulatory Visit: Payer: Self-pay | Admitting: Obstetrics

## 2013-04-05 ENCOUNTER — Encounter (HOSPITAL_COMMUNITY): Payer: Self-pay | Admitting: Anesthesiology

## 2013-04-05 ENCOUNTER — Inpatient Hospital Stay (HOSPITAL_COMMUNITY): Payer: 59 | Admitting: Anesthesiology

## 2013-04-05 ENCOUNTER — Encounter (HOSPITAL_COMMUNITY): Payer: Self-pay | Admitting: *Deleted

## 2013-04-05 ENCOUNTER — Inpatient Hospital Stay (HOSPITAL_COMMUNITY)
Admission: AD | Admit: 2013-04-05 | Discharge: 2013-04-07 | DRG: 765 | Disposition: A | Discharge status: Home / Self Care | Payer: 59 | Source: Ambulatory Visit | Attending: Obstetrics & Gynecology | Admitting: Obstetrics & Gynecology

## 2013-04-05 ENCOUNTER — Encounter (HOSPITAL_COMMUNITY)
Admission: AD | Disposition: A | Discharge status: Home / Self Care | Payer: Self-pay | Source: Ambulatory Visit | Attending: Obstetrics & Gynecology

## 2013-04-05 DIAGNOSIS — O99892 Other specified diseases and conditions complicating childbirth: Secondary | ICD-10-CM | POA: Diagnosis present

## 2013-04-05 DIAGNOSIS — D509 Iron deficiency anemia, unspecified: Secondary | ICD-10-CM | POA: Diagnosis present

## 2013-04-05 DIAGNOSIS — Z22330 Carrier of Group B streptococcus: Secondary | ICD-10-CM

## 2013-04-05 DIAGNOSIS — O9902 Anemia complicating childbirth: Secondary | ICD-10-CM | POA: Diagnosis present

## 2013-04-05 DIAGNOSIS — O42919 Preterm premature rupture of membranes, unspecified as to length of time between rupture and onset of labor, unspecified trimester: Secondary | ICD-10-CM | POA: Diagnosis present

## 2013-04-05 DIAGNOSIS — D689 Coagulation defect, unspecified: Secondary | ICD-10-CM | POA: Diagnosis not present

## 2013-04-05 DIAGNOSIS — O4290 Premature rupture of membranes, unspecified as to length of time between rupture and onset of labor, unspecified weeks of gestation: Secondary | ICD-10-CM | POA: Diagnosis present

## 2013-04-05 DIAGNOSIS — O34219 Maternal care for unspecified type scar from previous cesarean delivery: Principal | ICD-10-CM | POA: Diagnosis present

## 2013-04-05 DIAGNOSIS — D696 Thrombocytopenia, unspecified: Secondary | ICD-10-CM | POA: Diagnosis not present

## 2013-04-05 LAB — CBC
Hemoglobin: 12.1 g/dL (ref 12.0–15.0)
MCH: 31.2 pg (ref 26.0–34.0)
MCHC: 35.1 g/dL (ref 30.0–36.0)
Platelets: 149 10*3/uL — ABNORMAL LOW (ref 150–400)
RBC: 3.88 MIL/uL (ref 3.87–5.11)

## 2013-04-05 LAB — OB RESULTS CONSOLE HIV ANTIBODY (ROUTINE TESTING): HIV: NONREACTIVE

## 2013-04-05 LAB — RPR: RPR Ser Ql: NONREACTIVE

## 2013-04-05 LAB — OB RESULTS CONSOLE GBS: GBS: POSITIVE

## 2013-04-05 LAB — OB RESULTS CONSOLE RPR: RPR: NONREACTIVE

## 2013-04-05 LAB — TYPE AND SCREEN
ABO/RH(D): A POS
Antibody Screen: NEGATIVE

## 2013-04-05 SURGERY — Surgical Case
Anesthesia: Spinal | Site: Abdomen | Wound class: Clean Contaminated

## 2013-04-05 SURGERY — Surgical Case
Anesthesia: Regional

## 2013-04-05 MED ORDER — PHENYLEPHRINE HCL 10 MG/ML IJ SOLN
INTRAMUSCULAR | Status: DC | PRN
  Administered 2013-04-05: 40 ug via INTRAVENOUS
  Administered 2013-04-05 (×3): 80 ug via INTRAVENOUS

## 2013-04-05 MED ORDER — NALBUPHINE SYRINGE 5 MG/0.5 ML
INJECTION | INTRAMUSCULAR | Status: AC
  Administered 2013-04-05: 10 mg via SUBCUTANEOUS
  Filled 2013-04-05: qty 1

## 2013-04-05 MED ORDER — SENNOSIDES-DOCUSATE SODIUM 8.6-50 MG PO TABS
2.0000 | ORAL_TABLET | Freq: Every day | ORAL | Status: DC
  Administered 2013-04-06: 2 via ORAL

## 2013-04-05 MED ORDER — SCOPOLAMINE 1 MG/3DAYS TD PT72: 1.0000 | MEDICATED_PATCH | Freq: Once | TRANSDERMAL | Status: DC

## 2013-04-05 MED ORDER — WITCH HAZEL-GLYCERIN EX PADS: 1.0000 "application " | MEDICATED_PAD | CUTANEOUS | Status: DC | PRN

## 2013-04-05 MED ORDER — DIPHENHYDRAMINE HCL 25 MG PO CAPS: 25.0000 mg | ORAL_CAPSULE | ORAL | Status: DC | PRN

## 2013-04-05 MED ORDER — PHENYLEPHRINE 40 MCG/ML (10ML) SYRINGE FOR IV PUSH (FOR BLOOD PRESSURE SUPPORT)
PREFILLED_SYRINGE | INTRAVENOUS | Status: AC
  Filled 2013-04-05: qty 5

## 2013-04-05 MED ORDER — BUPIVACAINE IN DEXTROSE 0.75-8.25 % IT SOLN
INTRATHECAL | Status: DC | PRN
  Administered 2013-04-05: 1.3 mL via INTRATHECAL

## 2013-04-05 MED ORDER — KETOROLAC TROMETHAMINE 30 MG/ML IJ SOLN
30.0000 mg | Freq: Four times a day (QID) | INTRAMUSCULAR | Status: DC | PRN
  Administered 2013-04-06: 30 mg via INTRAVENOUS
  Filled 2013-04-05: qty 1

## 2013-04-05 MED ORDER — FENTANYL CITRATE 0.05 MG/ML IJ SOLN
INTRAMUSCULAR | Status: AC
  Filled 2013-04-05: qty 2

## 2013-04-05 MED ORDER — MENTHOL 3 MG MT LOZG: 1.0000 | LOZENGE | OROMUCOSAL | Status: DC | PRN

## 2013-04-05 MED ORDER — DIBUCAINE 1 % RE OINT: 1.0000 "application " | TOPICAL_OINTMENT | RECTAL | Status: DC | PRN

## 2013-04-05 MED ORDER — SIMETHICONE 80 MG PO CHEW
80.0000 mg | CHEWABLE_TABLET | Freq: Three times a day (TID) | ORAL | Status: DC
  Administered 2013-04-06 – 2013-04-07 (×5): 80 mg via ORAL

## 2013-04-05 MED ORDER — KETOROLAC TROMETHAMINE 60 MG/2ML IM SOLN: 60.0000 mg | Freq: Once | INTRAMUSCULAR | Status: AC | PRN

## 2013-04-05 MED ORDER — METOCLOPRAMIDE HCL 5 MG/ML IJ SOLN
10.0000 mg | Freq: Once | INTRAMUSCULAR | Status: AC
  Administered 2013-04-05: 10 mg via INTRAVENOUS
  Filled 2013-04-05: qty 2

## 2013-04-05 MED ORDER — CEFAZOLIN SODIUM-DEXTROSE 2-3 GM-% IV SOLR
2.0000 g | INTRAVENOUS | Status: AC
  Administered 2013-04-05: 2 g via INTRAVENOUS
  Filled 2013-04-05: qty 50

## 2013-04-05 MED ORDER — NALOXONE HCL 1 MG/ML IJ SOLN: 1.0000 ug/kg/h | INTRAMUSCULAR | Status: DC | PRN

## 2013-04-05 MED ORDER — SCOPOLAMINE 1 MG/3DAYS TD PT72
MEDICATED_PATCH | TRANSDERMAL | Status: AC
  Administered 2013-04-05: 1.5 mg via TRANSDERMAL
  Filled 2013-04-05: qty 1

## 2013-04-05 MED ORDER — DIPHENHYDRAMINE HCL 25 MG PO CAPS: 25.0000 mg | ORAL_CAPSULE | Freq: Four times a day (QID) | ORAL | Status: DC | PRN

## 2013-04-05 MED ORDER — IBUPROFEN 600 MG PO TABS
600.0000 mg | ORAL_TABLET | Freq: Four times a day (QID) | ORAL | Status: DC
  Administered 2013-04-06 – 2013-04-07 (×4): 600 mg via ORAL
  Filled 2013-04-05 (×4): qty 1

## 2013-04-05 MED ORDER — CITRIC ACID-SODIUM CITRATE 334-500 MG/5ML PO SOLN
30.0000 mL | Freq: Once | ORAL | Status: AC
  Administered 2013-04-05: 30 mL via ORAL

## 2013-04-05 MED ORDER — CITRIC ACID-SODIUM CITRATE 334-500 MG/5ML PO SOLN
30.0000 mL | ORAL | Status: DC | PRN
  Filled 2013-04-05: qty 15

## 2013-04-05 MED ORDER — DIPHENHYDRAMINE HCL 50 MG/ML IJ SOLN: 25.0000 mg | INTRAMUSCULAR | Status: DC | PRN

## 2013-04-05 MED ORDER — LANOLIN HYDROUS EX OINT: 1.0000 "application " | TOPICAL_OINTMENT | CUTANEOUS | Status: DC | PRN

## 2013-04-05 MED ORDER — ACETAMINOPHEN 160 MG/5ML PO SOLN
975.0000 mg | Freq: Once | ORAL | Status: AC
  Administered 2013-04-05: 975 mg via ORAL
  Filled 2013-04-05: qty 40

## 2013-04-05 MED ORDER — MEPERIDINE HCL 25 MG/ML IJ SOLN: 6.2500 mg | INTRAMUSCULAR | Status: DC | PRN

## 2013-04-05 MED ORDER — OXYTOCIN 40 UNITS IN LACTATED RINGERS INFUSION - SIMPLE MED: 62.5000 mL/h | INTRAVENOUS | Status: AC

## 2013-04-05 MED ORDER — MORPHINE SULFATE (PF) 0.5 MG/ML IJ SOLN
INTRAMUSCULAR | Status: DC | PRN
  Administered 2013-04-05: 4.9 mg via INTRAVENOUS

## 2013-04-05 MED ORDER — METOCLOPRAMIDE HCL 5 MG/ML IJ SOLN: 10.0000 mg | Freq: Three times a day (TID) | INTRAMUSCULAR | Status: DC | PRN

## 2013-04-05 MED ORDER — 0.9 % SODIUM CHLORIDE (POUR BTL) OPTIME
TOPICAL | Status: DC | PRN
  Administered 2013-04-05: 1000 mL

## 2013-04-05 MED ORDER — MORPHINE SULFATE (PF) 0.5 MG/ML IJ SOLN
INTRAMUSCULAR | Status: DC | PRN
  Administered 2013-04-05: .1 mg via INTRATHECAL

## 2013-04-05 MED ORDER — NALBUPHINE HCL 10 MG/ML IJ SOLN
5.0000 mg | INTRAMUSCULAR | Status: DC | PRN
  Filled 2013-04-05: qty 1

## 2013-04-05 MED ORDER — DIPHENHYDRAMINE HCL 50 MG/ML IJ SOLN: 12.5000 mg | INTRAMUSCULAR | Status: DC | PRN

## 2013-04-05 MED ORDER — ONDANSETRON HCL 4 MG/2ML IJ SOLN: 4.0000 mg | INTRAMUSCULAR | Status: DC | PRN

## 2013-04-05 MED ORDER — ONDANSETRON HCL 4 MG/2ML IJ SOLN: 4.0000 mg | Freq: Three times a day (TID) | INTRAMUSCULAR | Status: DC | PRN

## 2013-04-05 MED ORDER — ONDANSETRON HCL 4 MG/2ML IJ SOLN
INTRAMUSCULAR | Status: AC
  Filled 2013-04-05: qty 2

## 2013-04-05 MED ORDER — PENICILLIN G POTASSIUM 5000000 UNITS IJ SOLR
5.0000 10*6.[IU] | Freq: Once | INTRAVENOUS | Status: AC
  Administered 2013-04-05: 5 10*6.[IU] via INTRAVENOUS
  Filled 2013-04-05: qty 5

## 2013-04-05 MED ORDER — KETOROLAC TROMETHAMINE 30 MG/ML IJ SOLN: 30.0000 mg | Freq: Four times a day (QID) | INTRAMUSCULAR | Status: DC | PRN

## 2013-04-05 MED ORDER — LACTATED RINGERS IV SOLN
INTRAVENOUS | Status: DC
  Administered 2013-04-05 (×2): via INTRAVENOUS

## 2013-04-05 MED ORDER — FAMOTIDINE IN NACL 20-0.9 MG/50ML-% IV SOLN
20.0000 mg | Freq: Once | INTRAVENOUS | Status: AC
  Administered 2013-04-05: 20 mg via INTRAVENOUS
  Filled 2013-04-05: qty 50

## 2013-04-05 MED ORDER — PRENATAL MULTIVITAMIN CH
1.0000 | ORAL_TABLET | Freq: Every day | ORAL | Status: DC
  Administered 2013-04-06 – 2013-04-07 (×2): 1 via ORAL
  Filled 2013-04-05 (×2): qty 1

## 2013-04-05 MED ORDER — OXYTOCIN 10 UNIT/ML IJ SOLN
INTRAMUSCULAR | Status: AC
  Filled 2013-04-05: qty 4

## 2013-04-05 MED ORDER — ONDANSETRON HCL 4 MG PO TABS: 4.0000 mg | ORAL_TABLET | ORAL | Status: DC | PRN

## 2013-04-05 MED ORDER — CEFAZOLIN SODIUM-DEXTROSE 2-3 GM-% IV SOLR
INTRAVENOUS | Status: AC
  Filled 2013-04-05: qty 50

## 2013-04-05 MED ORDER — KETOROLAC TROMETHAMINE 60 MG/2ML IM SOLN
INTRAMUSCULAR | Status: AC
  Administered 2013-04-05: 60 mg via INTRAMUSCULAR
  Filled 2013-04-05: qty 2

## 2013-04-05 MED ORDER — FENTANYL CITRATE 0.05 MG/ML IJ SOLN: 25.0000 ug | INTRAMUSCULAR | Status: DC | PRN

## 2013-04-05 MED ORDER — PENICILLIN G POTASSIUM 5000000 UNITS IJ SOLR
2.5000 10*6.[IU] | INTRAVENOUS | Status: DC
  Filled 2013-04-05 (×3): qty 2.5

## 2013-04-05 MED ORDER — MORPHINE SULFATE 0.5 MG/ML IJ SOLN
INTRAMUSCULAR | Status: AC
  Filled 2013-04-05: qty 10

## 2013-04-05 MED ORDER — TETANUS-DIPHTH-ACELL PERTUSSIS 5-2.5-18.5 LF-MCG/0.5 IM SUSP: 0.5000 mL | Freq: Once | INTRAMUSCULAR | Status: DC

## 2013-04-05 MED ORDER — FENTANYL CITRATE 0.05 MG/ML IJ SOLN
INTRAMUSCULAR | Status: DC | PRN
  Administered 2013-04-05: 85 ug via INTRAVENOUS

## 2013-04-05 MED ORDER — SIMETHICONE 80 MG PO CHEW: 80.0000 mg | CHEWABLE_TABLET | ORAL | Status: DC | PRN

## 2013-04-05 MED ORDER — ONDANSETRON HCL 4 MG/2ML IJ SOLN
INTRAMUSCULAR | Status: DC | PRN
  Administered 2013-04-05: 4 mg via INTRAVENOUS

## 2013-04-05 MED ORDER — LACTATED RINGERS IV SOLN: INTRAVENOUS | Status: DC

## 2013-04-05 MED ORDER — LACTATED RINGERS IV SOLN
INTRAVENOUS | Status: DC | PRN
  Administered 2013-04-05: 19:00:00 via INTRAVENOUS

## 2013-04-05 MED ORDER — CEFAZOLIN SODIUM-DEXTROSE 2-3 GM-% IV SOLR: 2.0000 g | INTRAVENOUS | Status: DC

## 2013-04-05 MED ORDER — FENTANYL CITRATE 0.05 MG/ML IJ SOLN
INTRAMUSCULAR | Status: DC | PRN
  Administered 2013-04-05: 15 ug via INTRATHECAL

## 2013-04-05 MED ORDER — ZOLPIDEM TARTRATE 5 MG PO TABS: 5.0000 mg | ORAL_TABLET | Freq: Every evening | ORAL | Status: DC | PRN

## 2013-04-05 MED ORDER — SODIUM CHLORIDE 0.9 % IJ SOLN: 3.0000 mL | INTRAMUSCULAR | Status: DC | PRN

## 2013-04-05 MED ORDER — NALOXONE HCL 0.4 MG/ML IJ SOLN: 0.4000 mg | INTRAMUSCULAR | Status: DC | PRN

## 2013-04-05 MED ORDER — OXYTOCIN 10 UNIT/ML IJ SOLN
40.0000 [IU] | INTRAVENOUS | Status: DC | PRN
  Administered 2013-04-05: 40 [IU] via INTRAVENOUS

## 2013-04-05 MED ORDER — OXYCODONE-ACETAMINOPHEN 5-325 MG PO TABS
1.0000 | ORAL_TABLET | ORAL | Status: DC | PRN
  Administered 2013-04-06 – 2013-04-07 (×6): 1 via ORAL
  Filled 2013-04-05 (×6): qty 1

## 2013-04-05 SURGICAL SUPPLY — 42 items
BARRIER ADHS 3X4 INTERCEED (GAUZE/BANDAGES/DRESSINGS) ×2 IMPLANT
BENZOIN TINCTURE PRP APPL 2/3 (GAUZE/BANDAGES/DRESSINGS) ×2 IMPLANT
CLAMP CORD UMBIL (MISCELLANEOUS) IMPLANT
CLOTH BEACON ORANGE TIMEOUT ST (SAFETY) ×2 IMPLANT
CONTAINER PREFILL 10% NBF 15ML (MISCELLANEOUS) IMPLANT
DRAPE LG THREE QUARTER DISP (DRAPES) ×2 IMPLANT
DRSG OPSITE POSTOP 4X10 (GAUZE/BANDAGES/DRESSINGS) ×2 IMPLANT
DURAPREP 26ML APPLICATOR (WOUND CARE) ×2 IMPLANT
ELECT REM PT RETURN 9FT ADLT (ELECTROSURGICAL) ×2
ELECTRODE REM PT RTRN 9FT ADLT (ELECTROSURGICAL) ×1 IMPLANT
EXTRACTOR VACUUM KIWI (MISCELLANEOUS) IMPLANT
EXTRACTOR VACUUM M CUP 4 TUBE (SUCTIONS) IMPLANT
GLOVE BIO SURGEON STRL SZ7 (GLOVE) ×2 IMPLANT
GLOVE BIOGEL PI IND STRL 7.0 (GLOVE) ×3 IMPLANT
GLOVE BIOGEL PI INDICATOR 7.0 (GLOVE) ×3
GOWN STRL REIN XL XLG (GOWN DISPOSABLE) ×6 IMPLANT
KIT ABG SYR 3ML LUER SLIP (SYRINGE) IMPLANT
NEEDLE HYPO 25X5/8 SAFETYGLIDE (NEEDLE) IMPLANT
NS IRRIG 1000ML POUR BTL (IV SOLUTION) ×2 IMPLANT
PACK C SECTION WH (CUSTOM PROCEDURE TRAY) ×2 IMPLANT
PAD ABD 7.5X8 STRL (GAUZE/BANDAGES/DRESSINGS) ×2 IMPLANT
PAD OB MATERNITY 4.3X12.25 (PERSONAL CARE ITEMS) ×2 IMPLANT
RTRCTR C-SECT PINK 25CM LRG (MISCELLANEOUS) IMPLANT
STAPLER VISISTAT 35W (STAPLE) IMPLANT
STRIP CLOSURE SKIN 1/2X4 (GAUZE/BANDAGES/DRESSINGS) ×2 IMPLANT
STRIP CLOSURE SKIN 1/4X4 (GAUZE/BANDAGES/DRESSINGS) ×2 IMPLANT
SUT MON AB 2-0 SH 27 (SUTURE) ×2
SUT MON AB 2-0 SH27 (SUTURE) ×2 IMPLANT
SUT MON AB-0 CT1 36 (SUTURE) ×6 IMPLANT
SUT PLAIN 0 NONE (SUTURE) IMPLANT
SUT PLAIN 2 0 (SUTURE)
SUT PLAIN ABS 2-0 CT1 27XMFL (SUTURE) IMPLANT
SUT VIC AB 0 CT1 27 (SUTURE) ×2
SUT VIC AB 0 CT1 27XBRD ANBCTR (SUTURE) ×2 IMPLANT
SUT VIC AB 2-0 CT1 27 (SUTURE) ×1
SUT VIC AB 2-0 CT1 TAPERPNT 27 (SUTURE) ×1 IMPLANT
SUT VIC AB 4-0 KS 27 (SUTURE) ×2 IMPLANT
SUT VICRYL 0 TIES 12 18 (SUTURE) IMPLANT
TAPE CLOTH SURG 4X10 WHT LF (GAUZE/BANDAGES/DRESSINGS) ×2 IMPLANT
TOWEL OR 17X24 6PK STRL BLUE (TOWEL DISPOSABLE) ×6 IMPLANT
TRAY FOLEY CATH 14FR (SET/KITS/TRAYS/PACK) ×2 IMPLANT
WATER STERILE IRR 1000ML POUR (IV SOLUTION) ×2 IMPLANT

## 2013-04-05 NOTE — MAU Note (Signed)
Pt presents with leakage of fluid since 0700 this am, clear fluid.  Pt denies bleeding, contraactions, and pih symptoms. + fnm per pt.  Pt with previous c/s

## 2013-04-05 NOTE — OR Nursing (Signed)
75 ml blood loss during fundal massage by DLWegner RN

## 2013-04-05 NOTE — Transfer of Care (Signed)
Immediate Anesthesia Transfer of Care Note  Patient: Kaitlyn Daniels  Procedure(s) Performed: Procedure(s): Repeat cesarean section with delivery of baby boy at 65. (N/A)  Patient Location: PACU  Anesthesia Type:Spinal  Level of Consciousness: awake  Airway & Oxygen Therapy: Patient Spontanous Breathing  Post-op Assessment: Report given to PACU RN and Post -op Vital signs reviewed and stable  Post vital signs: stable  Complications: No apparent anesthesia complications

## 2013-04-05 NOTE — Anesthesia Preprocedure Evaluation (Addendum)
Anesthesia Evaluation  Patient identified by MRN, date of birth, ID band Patient awake    Reviewed: Allergy & Precautions, H&P , NPO status , Patient's Chart, lab work & pertinent test results, reviewed documented beta blocker date and time   History of Anesthesia Complications Negative for: history of anesthetic complications  Airway Mallampati: I TM Distance: >3 FB Neck ROM: full    Dental  (+) Teeth Intact   Pulmonary neg pulmonary ROS,  breath sounds clear to auscultation        Cardiovascular negative cardio ROS  Rhythm:regular Rate:Normal     Neuro/Psych negative neurological ROS  negative psych ROS   GI/Hepatic negative GI ROS, Neg liver ROS,   Endo/Other  negative endocrine ROS  Renal/GU negative Renal ROS  negative genitourinary   Musculoskeletal   Abdominal   Peds  Hematology negative hematology ROS (+)   Anesthesia Other Findings Ate lunch at 12:00 pm - will give pepcid, reglan and bicitra  Reproductive/Obstetrics (+) Pregnancy (h/o c/s x1, PPROM)                          Anesthesia Physical Anesthesia Plan  ASA: II and emergent  Anesthesia Plan: Spinal   Post-op Pain Management:    Induction:   Airway Management Planned:   Additional Equipment:   Intra-op Plan:   Post-operative Plan:   Informed Consent: I have reviewed the patients History and Physical, chart, labs and discussed the procedure including the risks, benefits and alternatives for the proposed anesthesia with the patient or authorized representative who has indicated his/her understanding and acceptance.     Plan Discussed with: Surgeon and CRNA  Anesthesia Plan Comments:        Anesthesia Quick Evaluation

## 2013-04-05 NOTE — Op Note (Signed)
Cesarean Section Procedure Note  Kaitlyn Daniels  04/05/2013  Procedure: Repeat Low Transverse Cesarean Section   Indications: 35.6 wks presented with ruptured membranes, prior cesarean history, repeat cesarean desired.    Pre-operative Diagnosis: previous cesarean section/rupture of membranes, labor   Post-operative Diagnosis: Same   Surgeon: Robley Fries, MD  Assistants: Marlinda Mike. CNM  Anesthesia: Spinal  Procedure Details:  The patient was seen in the Labor Room. She was admitted with ruptured membranes but presented to hospital after full meal and was not in labor at admission hence we were waiting for 8 hr NPO status, starting Penicillin for GBS prophylaxis.. She then continued to have regular contractions and dilated to 3-4 cm, so we proceeded with repeat cesarean section 6 hr NPO.  The risks, benefits, complications, treatment options, and expected outcomes were discussed with the patient. The patient concurred with the proposed plan, giving informed consent. identified as Kaitlyn Daniels and the procedure verified as Repeat C-Section Delivery. She was brought to the Operating Room. A Time Out was held and the above information confirmed. She received 2 gm Ancef.  After induction of Spinal anesthesia, the patient was draped and prepped in the usual sterile manner. A Pfannenstiel Incision was made through prior scan and carried down through scarred subcutaneous tissue to the fascia, fascial incision was made and extended transversely. The fascia was separated from the underlying rectus tissue superiorly and inferiorly where dense adhesions of muscles to the fascia were noted. The peritoneum was identified and entered. Peritoneal incision was extended longitudinally. Large lower segment vascularity noted including veins on the bladder surface.  The utero-vesical peritoneal reflection was incised transversely and the bladder flap was bluntly freed from the lower uterine segment. A low  transverse uterine incision was made. Large amount of clear amniotic fluid drained. Baby was floating. Delivered from cephalic presentation was a 6 lb 7 oz Female infant with one vacuum assistance as incision was tight and baby was floating. Bulb suction done, cord clamped and cut and baby handed to NIC team.  Apgar scores of 8 at one minute and 9 at five minutes. Cord ph was not sent the umbilical cord blood was obtained for evaluation. The placenta was removed Intact and appeared normal. The uterine outline, tubes and ovaries appeared normal. The uterine incision was closed with running locked sutures of 0 Monocryl followed by a second imbricating layer.  Bladder dome vascular bleeding were sutured with 2-0 Monocryl. Hemostasis was observed. Interceed placed on hysterotomy and under the peritoneum. The peritoneum was closed with 2-0 Vicryl. Muscles approximated in midline. The fascia was then reapproximated with running sutures of 0Vicryl. The subcutaneous scar release done and subcuticular closure was performed using 4-0Vicryl. Sterile dressing placed.   Instrument, sponge, and needle counts were correct prior the abdominal closure and were correct at the conclusion of the case.   Findings: Female infant delivered cephalic with one vacuum assistance as incision was tight and baby was floating. Weight 6 lb 7 oz.  Apgar scores of 8 at one minute and 9 at five minutes. Normal placenta, 3 vessel cord. Normal both the ovaries and tubes.    Estimated Blood Loss: 800 cc  Total IV Fluids: 3200 cc LR  Urine Output: 200CC OF clear urine  Specimens: cord blood  Complications: no complications  Disposition: PACU - hemodynamically stable.   Maternal Condition: stable   Baby condition / location:  nursery-stable  Attending Attestation: I performed the procedure.   Signed: Surgeon(s): [employer]  Graciella Belton, MD

## 2013-04-05 NOTE — MAU Provider Note (Signed)
  History     CSN: [id number]  Arrival date and time: 04/05/13 1354 Provider called to see patient @ 1436 Provider her to see patient @ 1455     Chief Complaint  Patient presents with  . Rupture of Membranes   HPI  TC from patient at 1240 with concern for possible ROM  - leaking clear fluid since 0700 Instructed to come directly to hospital - do not eat or drink  states ate full meal prior to calling about ROM  (grilled chicken / salad) No ctx or cramping No pain or bleeding   Past Medical History  Diagnosis Date  . No pertinent past medical history   . Acute blood loss anemia 05/15/2011  . Arthritis     Past Surgical History  Procedure Laterality Date  . Cesarean section  05/13/2011    Procedure: CESAREAN SECTION;  Surgeon: Robley Fries;  Location: [facility];  Service: Gynecology;  Laterality: N/A;  Primary cesarean section with delivery of baby boy at 9. Apgars 4/8.    Family History  Problem Relation Age of Onset  . Cancer Maternal Aunt   . Cancer Paternal Grandfather     History  Substance Use Topics  . Smoking status: Never Smoker   . Smokeless tobacco: Not on file  . Alcohol Use: Yes     Comment: occasionally    Allergies: No Known Allergies  Prescriptions prior to admission  Medication Sig Dispense Refill  . polysaccharide iron (NIFEREX) 150 MG CAPS capsule Take 1 capsule (150 mg total) by mouth 2 (two) times daily.  60 each  3  . prenatal vitamin w/FE, FA (PRENATAL 1 + 1) 27-1 MG TABS Take 1 tablet by mouth daily.          ROS Physical Exam   Blood pressure 126/71, pulse 109, temperature 98.1 F (36.7 C), temperature source Oral, resp. rate 18, height 5\' 3"  (1.6 m), weight 77.202 kg (170 lb 3.2 oz).  Physical Exam Pleasant NAD or pain Abdomen soft and non-tender / uterus gravid and non-tender Sterile spec exam - large clear pooling / + nitrazine Cervix appears closed - defer digital exam  FHR 130 baseline / moderate variability / + accels /  no decels TOCO  UI - no ctx  MAU Course  Procedures   Assessment and Plan  35.6 weeks confirmed PPROM at 8 hours  previous CS - desires repeat cesarean section + GBS status  Ate full meal at noon   1) admit orders initiated with IV start and PCN per protocol now 2) consult Dr Rodman Pickle (anesthesia) - states earliest CS 8pm unless emergency due to full stomach 3) notified Dr Juliene Pina - will post for 8pm - monitor closely on BS for change in status   Marlinda Mike 04/05/2013, 3:18 PM

## 2013-04-05 NOTE — Progress Notes (Signed)
Patient ID: Kaitlyn Daniels, female   DOB: 02/18/1981, 32 y.o.   MRN: [id number] Pt comfortable, aware of UCs but no pain.  FHT 130s/ category I Toco q 2 min, spontaneous labor SVE 3-4 cm dilated/ 70%/-2/ Vtx, clear fluid  Proceed with repeat c-section note due to labor and not wait until NPO 8 hrs. D/w pt and anesthesia, agree.   Risks/complications of surgery reviewed incl infection, bleeding, damage to internal organs including bladder, bowels, ureters, blood vessels, other risks from anesthesia, VTE and delayed complications of any surgery, complications in future surgery reviewed. Also discussed neonatal complications incl difficult delivery, laceration, vacuum assistance, TTN etc. Pt understands and agrees, all concerns addressed.

## 2013-04-05 NOTE — Anesthesia Procedure Notes (Signed)
Spinal  Patient location during procedure: OR Start time: 04/05/2013 6:23 PM Staffing Performed by: anesthesiologist  Preanesthetic Checklist Completed: patient identified, site marked, surgical consent, pre-op evaluation, timeout performed, IV checked, risks and benefits discussed and monitors and equipment checked Spinal Block Patient position: sitting Prep: site prepped and draped and DuraPrep Patient monitoring: continuous pulse ox and heart rate Approach: midline Location: L3-4 Injection technique: single-shot Needle Needle type: Pencan  Needle gauge: 24 G Needle length: 9 cm Assessment Sensory level: T4 Additional Notes Clear free flow CSF on first attempt.  No paresthesia.  Patient tolerated procedure well with no apparent complications.  Jasmine December, MD

## 2013-04-05 NOTE — MAU Note (Signed)
Pt reports having a small gush of  Fluid around 7am and has had some trickling ever since. Denies pain or discomfort and reports good fetal movement.

## 2013-04-05 NOTE — MAU Provider Note (Signed)
Reviewed and agree with note and plan. V.Kaylon Hitz, MD  

## 2013-04-05 NOTE — OR Nursing (Signed)
Uterus massaged by D. Loreta Ave RN.  Two tubes of cord blood sent to lab.

## 2013-04-05 NOTE — H&P (Signed)
MD note Pt assessed, reviewed above H&P done, agree with above note and plan. Will need to delay surgery per Anesthesia recommendations since not in labor with regards to NPO safety. Patient ate lunch despite instructions by CNM on phone when contacted for SRO. Begin GBS coverage until c/s.   Hilary Hertz, MD

## 2013-04-05 NOTE — Anesthesia Postprocedure Evaluation (Signed)
Anesthesia Post Note  Patient: Kaitlyn Daniels  Procedure(s) Performed: Procedure(s) (LRB): Repeat cesarean section with delivery of baby boy at 84. (N/A)  Anesthesia type: Spinal  Patient location: PACU  Post pain: Pain level controlled  Post assessment: Post-op Vital signs reviewed  Last Vitals:  Filed Vitals:   04/05/13 2015  BP: 99/65  Pulse: 67  Temp:   Resp: 15    Post vital signs: Reviewed  Level of consciousness: awake  Complications: No apparent anesthesia complications

## 2013-04-05 NOTE — H&P (Signed)
OB ADMISSION/ HISTORY & PHYSICAL:  Admission Date: 04/05/2013  1:54 PM  Admit Diagnosis: PPROM / +GBS carrier status  Kaitlyn Daniels is a 32 y.o. female presenting for PPROM at 0700 this am / no ctx. Called today at 1230 to report SROM after eating full meal at lunch (noon). Unaware of ctx or labor signs. Scheduled for repeat CS end of August.   Prenatal History: G2P1001   EDC : 05/04/2013, Alternate EDD Entry  Prenatal care at [facility]  Primary Ob Provider: Ernestina Penna Prenatal course complicated by persistent GBS / excessive maternal weight gain (38pounds) / Size>dates with borderline GTT / previous CS - desires repeat / IDA of pregnancy  Prenatal Labs: ABO, Rh:  A+ Antibody:  negative Rubella:   immune RPR:   NR HBsAg:   negative HIV:   NR GTT: 134 GBS:   POS with persistent bacturia  Medical / Surgical History :  Past medical history:  Past Medical History  Diagnosis Date  . No pertinent past medical history   . Acute blood loss anemia 05/15/2011  . Arthritis      Past surgical history:  Past Surgical History  Procedure Laterality Date  . Cesarean section  05/13/2011    Procedure: CESAREAN SECTION;  Surgeon: Robley Fries;  Location: [facility];  Service: Gynecology;  Laterality: N/A;  Primary cesarean section with delivery of baby boy at 10. Apgars 4/8.    Family History:  Family History  Problem Relation Age of Onset  . Cancer Maternal Aunt   . Cancer Paternal Grandfather      Social History:  reports that she has never smoked. She does not have any smokeless tobacco history on file. She reports that  drinks alcohol. She reports that she does not use illicit drugs.  Allergies: Review of patient's allergies indicates no known allergies.   Current Medications at time of admission:  Prior to Admission medications   Medication Sig Start Date End Date Taking? Authorizing Provider  polysaccharide iron (NIFEREX) 150 MG CAPS capsule Take 1 capsule  (150 mg total) by mouth 2 (two) times daily. 05/16/11   Arlan Organ, CNM  prenatal vitamin w/FE, FA (PRENATAL 1 + 1) 27-1 MG TABS Take 1 tablet by mouth daily.      Historical Provider, MD    Review of Systems: Active FM LOF  / SROM @ 0700 - clear bloody show none  Physical Exam:  VS: Blood pressure 126/71, pulse 109, temperature 98.1 F (36.7 C), temperature source Oral, resp. rate 18, height 5\' 3"  (1.6 m), weight 77.202 kg (170 lb 3.2 oz).  General: alert and oriented, appears comfortable / NAD Heart: RRR Lungs: Clear lung fields Abdomen: Gravid, soft and non-tender, non-distended / uterus: gravid and non-tender Extremities: no edema  Genitalia / VE:   deferred  FHR: baseline rate 130 / variability moderate / accelerations + / no decelerations TOCO: no ctx  Assessment: 35.[redacted] weeks gestation PPROM at 8 hours at presentation + GBS FHR category 1 Previous CS - desires repeat cesarean section   Plan:  Admit NPO / labor and pre-op orders initiated Per anesthesia - Dr Rodman Pickle - CS at 8pm unless urgent Dr Juliene Pina notified of CS timing - case posted for 8pm  PCN protocol - 2 doses prior to delivery / Ancef on-call to OR for perioperative ABX Continuous EFM in BS until CS time - monitor closely for any change in status   Marlinda Mike CNM, MSN, [facility] 04/05/2013, 3:10 PM

## 2013-04-06 LAB — CBC
MCH: 31.1 pg (ref 26.0–34.0)
MCHC: 35 g/dL (ref 30.0–36.0)
MCV: 88.9 fL (ref 78.0–100.0)
Platelets: 120 10*3/uL — ABNORMAL LOW (ref 150–400)
RBC: 3.34 MIL/uL — ABNORMAL LOW (ref 3.87–5.11)
RDW: 13.5 % (ref 11.5–15.5)

## 2013-04-06 NOTE — Progress Notes (Signed)
POSTOPERATIVE DAY # 1 S/P CS  S:         Reports feeling well - no concerns - interested in possible early DC tomorrow             Tolerating po intake / no nausea / no vomiting / + flatus / no BM             Bleeding is light             Pain controlled with motrin and percocet             Up ad lib / ambulatory/ voiding QS  Newborn breast feeding  / Circumcision planned for tomorrow AM  O:  VS: BP 84/59  Pulse 73  Temp(Src) 98.4 F (36.9 C) (Oral)  Resp 20  Ht 5\' 3"  (1.6 m)  Wt 77.202 kg (170 lb 3.2 oz)  BMI 30.16 kg/m2  SpO2 98%   LABS:               Recent Labs  04/05/13 1515 04/06/13 0620  WBC 13.7* 15.1*  HGB 12.1 10.4*  PLT 149* 120*               Bloodtype: --/--/A POS, A POS (08/02 1750)  Rubella:     Immune                                        I&O: + 3359 (foley currently with clear straw urine)             Physical Exam:             Alert and Oriented X3  Lungs: Clear and unlabored  Heart: regular rate and rhythm / no mumurs  Abdomen: soft, non-tender, mildly distended, hypoactive BS             Fundus: firm, non-tender, U-1             Dressing pressure dressing intact              Lochia: light  Extremities: no edema, no calf pain or tenderness, SCD in place  A:        POD # 1 S/P cesarean section - PPROM at 35.6weeks / repeat CS            Mild ABL anemia - stable  P:        Routine postoperative care              Maintain good oral hydration  / advance postop activity             Consider early DC tomorrow PM if newborn dc by Peds   Marlinda Mike CNM, MSN, FACNM 04/06/2013, 11:13 AM

## 2013-04-06 NOTE — Lactation Note (Signed)
This note was copied from the chart of Kaitlyn Daniels. Lactation Consultation Note  Patient Name: Kaitlyn Daniels [id number] Date: 04/06/2013 Reason for consult: Initial assessment   Maternal Data Formula Feeding for Exclusion: No Does the patient have breastfeeding experience prior to this delivery?: Yes  Feeding   LATCH Score/Interventions     Lactation Tools Discussed/Used     Consult Status Consult Status: Follow-up Date: 04/07/13 Follow-up type: In-patient  Mom complaining of sore nipples. Both nipples bright pink but intact. Comfort gels given with instructions. Baby sleepy at present- would not latch at this time.To call for assist prn.  Pamelia Hoit 04/06/2013, 2:45 PM

## 2013-04-06 NOTE — Lactation Note (Addendum)
This note was copied from the chart of Kaitlyn Isley Weisheit. Lactation Consultation Note: Initial visit with mom who plans to breast feed her baby per admission note on 04/05/13 at 1845. Experienced BF mom reports that baby is latching well with no pain. Baby asleep on mom's chest skin to skin. BF brochure given with resources for support after DC. To call for assist prn.  Patient Name: Kaitlyn Daniels [id number] Date: 04/06/2013 Reason for consult: Initial assessment   Maternal Data Formula Feeding for Exclusion: No Does the patient have breastfeeding experience prior to this delivery?: Yes  Feeding   LATCH Score/Interventions                      Lactation Tools Discussed/Used     Consult Status Consult Status: Follow-up Date: 04/07/13 Follow-up type: In-patient    Pamelia Hoit 04/06/2013, 12:09 PM

## 2013-04-06 NOTE — Lactation Note (Signed)
This note was copied from the chart of Kaitlyn Daniels. Lactation Consultation Note: called to observe feeding. Mom had baby latched to breast when I went in. Untucked bottom lip and mom reports that feels better. Reviewed basic teaching. No further questions at present. To call prn  Patient Name: Kaitlyn Audryanna Zurita [id number] Date: 04/06/2013 Reason for consult: Follow-up assessment   Maternal Data Formula Feeding for Exclusion: No Does the patient have breastfeeding experience prior to this delivery?: Yes  Feeding   LATCH Score/Interventions Latch: Grasps breast easily, tongue down, lips flanged, rhythmical sucking.  Audible Swallowing: None  Type of Nipple: Everted at rest and after stimulation  Comfort (Breast/Nipple): Filling, red/small blisters or bruises, mild/mod discomfort  Problem noted: Mild/Moderate discomfort Interventions (Mild/moderate discomfort): Comfort gels  Hold (Positioning): No assistance needed to correctly position infant at breast.  LATCH Score: 7  Lactation Tools Discussed/Used     Consult Status Consult Status: Follow-up Date: 04/07/13 Follow-up type: In-patient    Pamelia Hoit 04/06/2013, 3:31 PM

## 2013-04-06 NOTE — Anesthesia Postprocedure Evaluation (Signed)
Anesthesia Post Note  Patient: Kaitlyn Daniels  Procedure(s) Performed: * No procedures listed *  Anesthesia type: Epidural  Patient location: Mother/Baby  Post pain: Pain level controlled  Post assessment: Post-op Vital signs reviewed  Last Vitals:  Filed Vitals:   04/06/13 0600  BP: 94/58  Pulse: 60  Temp: 36.3 C  Resp: 18    Post vital signs: Reviewed  Level of consciousness:alert  Complications: No apparent anesthesia complications

## 2013-04-07 ENCOUNTER — Encounter (HOSPITAL_COMMUNITY): Payer: Self-pay | Admitting: Obstetrics & Gynecology

## 2013-04-07 MED ORDER — IBUPROFEN 600 MG PO TABS: 600.0000 mg | ORAL_TABLET | Freq: Four times a day (QID) | ORAL | Status: DC

## 2013-04-07 MED ORDER — OXYCODONE-ACETAMINOPHEN 5-325 MG PO TABS: 1.0000 | ORAL_TABLET | ORAL | Status: DC | PRN

## 2013-04-07 NOTE — Discharge Summary (Signed)
POST OPERATIVE DISCHARGE SUMMARY:  Patient ID: Kaitlyn Daniels MRN: [id number] DOB/AGE: 10-May-1981 32 y.o.  Admit date: 04/05/2013 Admission Diagnoses: 35.[redacted] week gestation, SROM   Discharge date: 04/07/2013    Discharge Diagnoses: S/p Cesarean Section  Prenatal history: G2P1101   EDC : 05/04/2013, Alternate EDD Entry  Prenatal care at [facility]  Primary provider : Dr. Ernestina Penna Prenatal course complicated by IDA, GBS bateriuria, borderline GTT, size > dates  Prenatal Labs: ABO, Rh:   A POS Antibody: NEG (08/02 1750) Rubella:   Immune RPR: NON REACTIVE (08/02 1515)  HBsAg:   NON REACTIVE HIV: Non-reactive (08/02 0000)  GBS: Positive (08/02 0000)  GTT: 134  Medical / Surgical History :  Past medical history:  Past Medical History  Diagnosis Date  . No pertinent past medical history   . Acute blood loss anemia 05/15/2011  . Arthritis     Past surgical history:  Past Surgical History  Procedure Laterality Date  . Cesarean section  05/13/2011    Procedure: CESAREAN SECTION;  Surgeon: Robley Fries;  Location: [facility];  Service: Gynecology;  Laterality: N/A;  Primary cesarean section with delivery of baby boy at 72. Apgars 4/8.    Family History:  Family History  Problem Relation Age of Onset  . Cancer Maternal Aunt   . Cancer Paternal Grandfather     Social History:  reports that she has never smoked. She does not have any smokeless tobacco history on file. She reports that  drinks alcohol. She reports that she does not use illicit drugs.   Allergies: Review of patient's allergies indicates no known allergies.    Current Medications at time of admission:  Prior to Admission medications   Medication Sig Start Date End Date Taking? Authorizing Provider  IRON PO Take 1 tablet by mouth daily.   Yes Historical Provider, MD  Prenatal Vit-Fe Fumarate-FA (PRENATAL MULTIVITAMIN) TABS Take 1 tablet by mouth daily at 12 noon.   Yes Historical Provider, MD   ibuprofen (ADVIL,MOTRIN) 600 MG tablet Take 1 tablet (600 mg total) by mouth every 6 (six) hours. 04/07/13   Lawernce Pitts, CNM  oxyCODONE-acetaminophen (PERCOCET/ROXICET) 5-325 MG per tablet Take 1-2 tablets by mouth every 4 (four) hours as needed. 04/07/13   Lawernce Pitts, CNM     Intrapartum Course: GBS prophylaxis, repeat cesarean section   Procedures: Cesarean section delivery of female newborn by Dr Juliene Pina on 04/05/2013 See operative report for further details APGAR (1 MIN): 8   APGAR (5 MINS): 8     Postoperative / postpartum course: complicated by mild ABL anemia and thrombocytopenia  Physical Exam:   VSS: Temp:  [97.3 F (36.3 C)-98.4 F (36.9 C)] 98.1 F (36.7 C) (08/04 0530) Pulse Rate:  [58-73] 58 (08/04 0530) Resp:  [16-20] 18 (08/04 0530) BP: (84-99)/(59-64) 94/61 mmHg (08/04 0530) SpO2:  [98 %] 98 % (08/03 1315)  LABS:  Recent Labs  04/05/13 1515 04/06/13 0620  WBC 13.7* 15.1*  HGB 12.1 10.4*  PLT 149* 120*    General: alert and oriented x3 Heart: RRR Lungs: clear  Abdomen: soft and non-tender / non-distended / active BS  Extremities: negativge edema / negative Homans  Dressing: clean, dry, intact honeycomb dressing Incision:  approximated with subculticular / negative erythema / negative ecchymosis / negative drainage  Discharge Instructions:  Discharged Condition: good Activity: pelvic rest and postoperative restrictions x 2 weeks Diet: routine Medications: see below   Medication List  ibuprofen 600 MG tablet  Commonly known as:  ADVIL,MOTRIN  Take 1 tablet (600 mg total) by mouth every 6 (six) hours.     IRON PO  Take 1 tablet by mouth daily.     oxyCODONE-acetaminophen 5-325 MG per tablet  Commonly known as:  PERCOCET/ROXICET  Take 1-2 tablets by mouth every 4 (four) hours as needed.     prenatal multivitamin Tabs tablet  Take 1 tablet by mouth daily at 12 noon.       Wound Care: Keep dressing clean and dry, remove in  1-2 days Clean incision with warm water and dry thoroughly Call the office for redness, swelling, or drainage from incision  Postpartum Instructions: [facility] discharge booklet - instructions reviewed Discharge to: Home  Follow up : [facility] in 6 weeks for routine postpartum visit                      Signed: Donette Larry, N MSN, CNM 04/07/2013, 9:38 AM

## 2013-04-07 NOTE — Progress Notes (Addendum)
POD # 2  Subjective: Pt reports feeling a little sore/ Pain controlled with motrin and percocet Tolerating po/Voiding without problems/ No n/v/Flatus present Activity: ad lib Bleeding is light Newborn info:  Information for the patient's newborn:  Khala, Tarte [[id number]]  female  / Circumcision: Dr. Ernestina Penna to do today/ Feeding: breast   Objective: VS: BP 94/61  Pulse 58  Temp(Src) 98.1 F (36.7 C) (Oral)  Resp 18  Ht 5\' 3"  (1.6 m)  Wt 77.202 kg (170 lb 3.2 oz)  BMI 30.16 kg/m2  SpO2 98%  I&O: Intake/Output     08/03 0701 - 08/04 0700 08/04 0701 - 08/05 0700   P.O. 960    I.V. (mL/kg) 500 (6.5)    Total Intake(mL/kg) 1460 (18.9)    Urine (mL/kg/hr) 2900 (1.6)    Blood     Total Output 2900     Net -1440          Urine Occurrence 800 x      LABS:  Recent Labs  04/05/13 1515 04/06/13 0620  WBC 13.7* 15.1*  HGB 12.1 10.4*  PLT 149* 120*                           Physical Exam:  General: alert and cooperative CV: Regular rate and rhythm Resp: clear Abdomen: soft, nontender, normal bowel sounds Incision: healing well, no drainage, no erythema, no swelling, honeycomb drg clean, dry, intact Uterine Fundus: firm, below umbilicus, nontender Lochia: minimal Ext: extremities normal, atraumatic, no cyanosis or edema and Homans sign is negative, no sign of DVT    Assessment: POD # 2/ G2P1101/ S/P C/Section d/t SROM, repeat Mild ABL anemia; stable Thrombocytopenia; stable Doing well and stable for discharge home  Plan: Discharge home RX's: Ibuprofen 600mg  po Q 6 hrs prn pain #30 Refill x 1 Percocet 5/325 1 - 2 tabs po every 6 hrs prn pain  #30 No refill Niferex 150mg  po daily    Signed: Donette Larry, N, MSN, CNM 04/07/2013, 9:30 AM

## 2013-04-11 ENCOUNTER — Encounter (HOSPITAL_COMMUNITY): Payer: Self-pay | Admitting: *Deleted

## 2013-04-15 ENCOUNTER — Encounter (HOSPITAL_COMMUNITY): Payer: Self-pay | Admitting: Pharmacy Technician

## 2013-04-28 ENCOUNTER — Inpatient Hospital Stay (HOSPITAL_COMMUNITY): Admission: RE | Admit: 2013-04-28 | Payer: 59 | Source: Ambulatory Visit

## 2013-04-29 ENCOUNTER — Encounter (HOSPITAL_COMMUNITY): Admission: RE | Payer: Self-pay | Source: Ambulatory Visit

## 2013-04-29 ENCOUNTER — Inpatient Hospital Stay (HOSPITAL_COMMUNITY): Admission: RE | Admit: 2013-04-29 | Payer: 59 | Source: Ambulatory Visit | Admitting: Obstetrics & Gynecology

## 2013-04-29 SURGERY — Surgical Case
Anesthesia: Spinal

## 2013-07-20 IMAGING — US US SOFT TISSUE HEAD/NECK
1 series · 14 of 25 positions shown · non-contrast
Comparison: None.

CLINICAL DATA: Enlarged thyroid on physical exam

THYROID ULTRASOUND
TECHNIQUE: Ultrasound examination of the thyroid gland and adjacent
soft tissues was performed.

[Series 1: us soft tissue head/neck · 0.09mm/px · 14 of 44 slices shown]
[im 1/44]
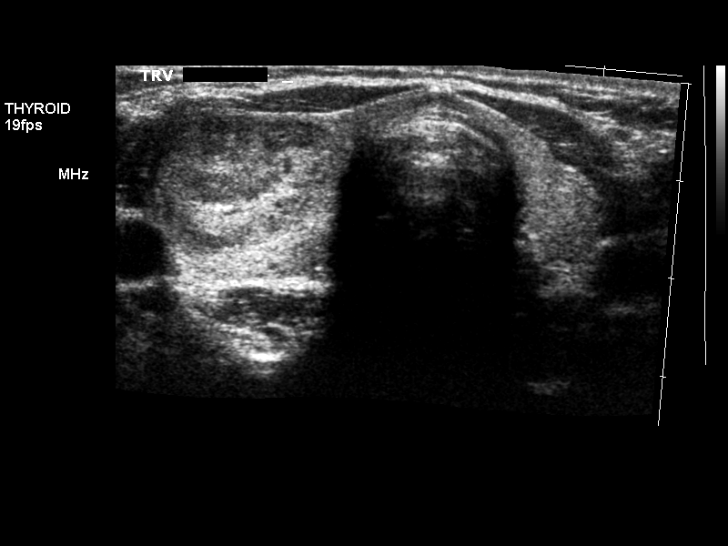
[im 4/44]
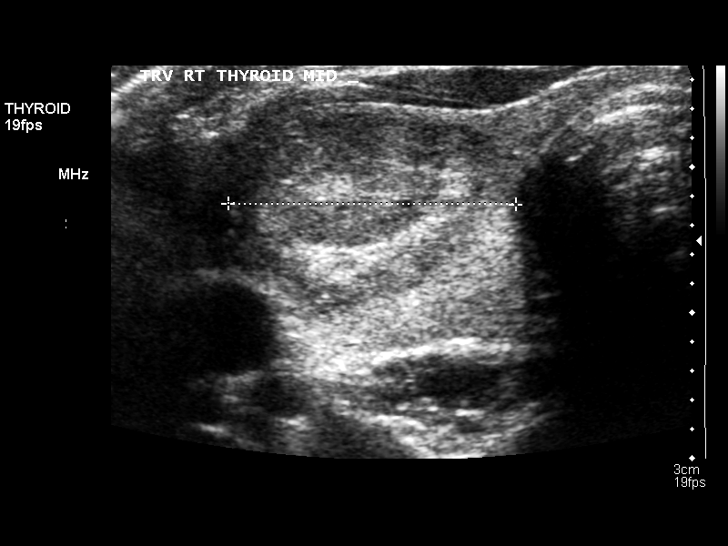
[im 8/44]
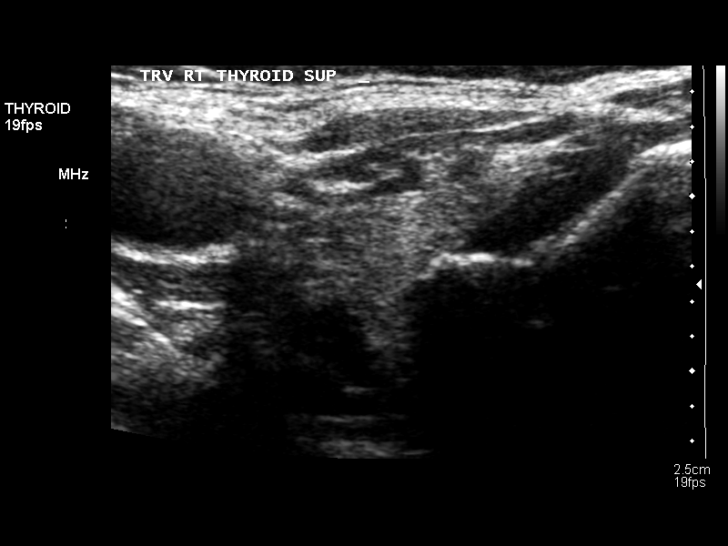
[im 11/44]
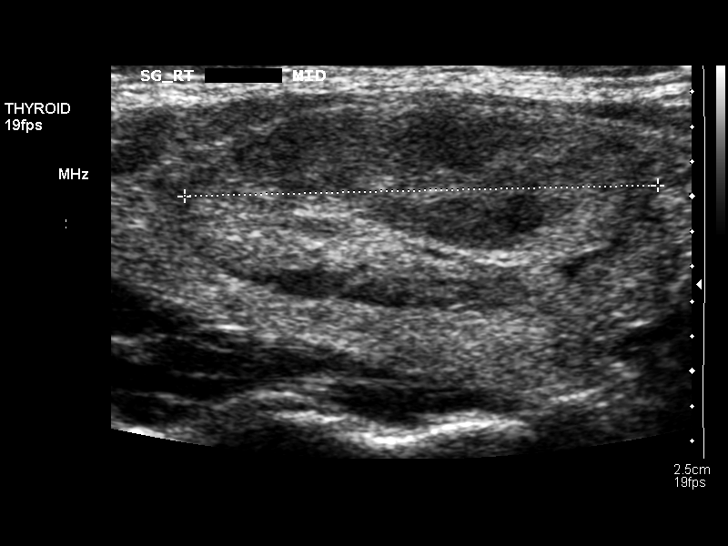
[im 15/44]
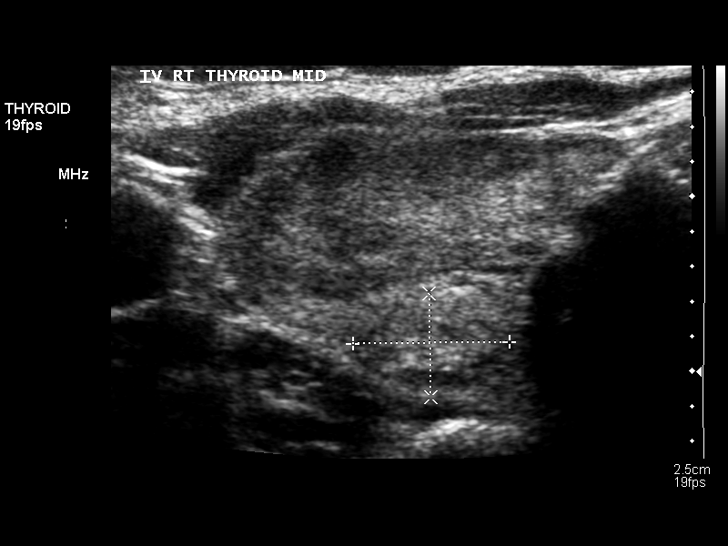
[im 17/44]
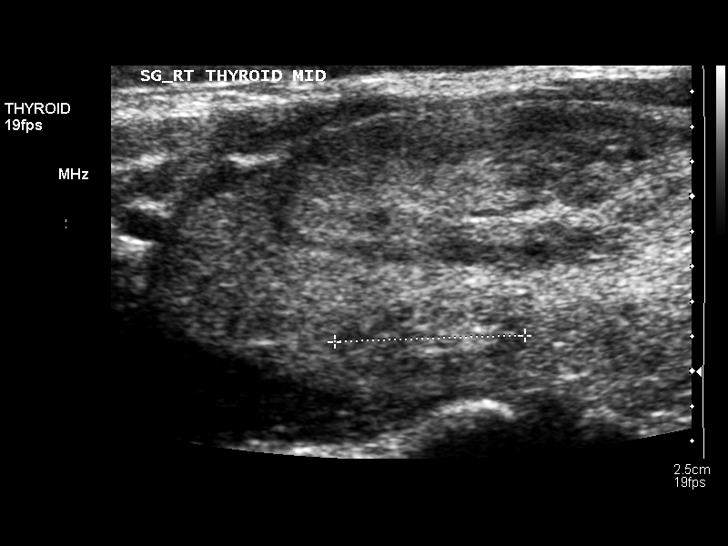
[im 20/44]
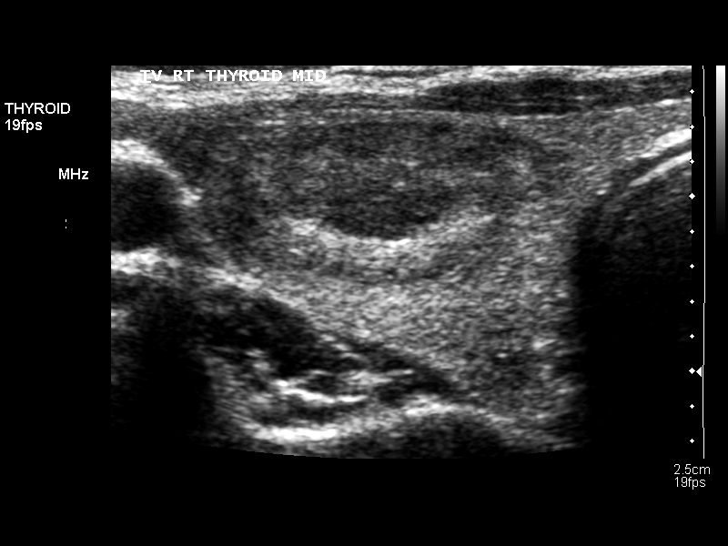
[im 24/44]
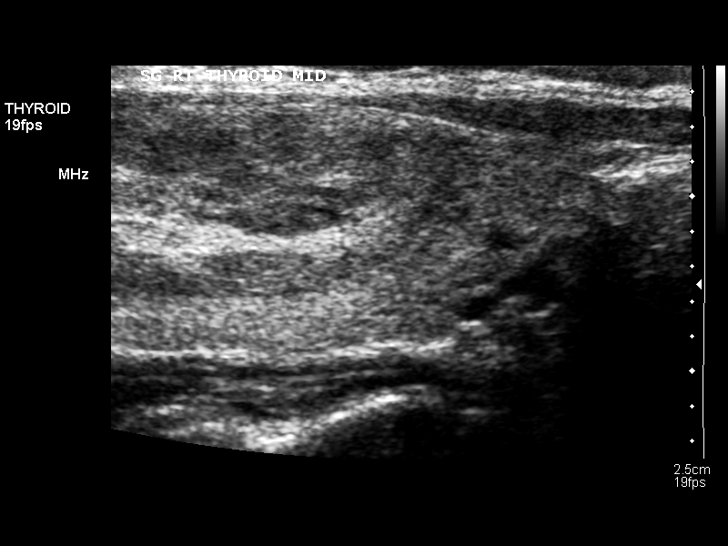
[im 27/44]
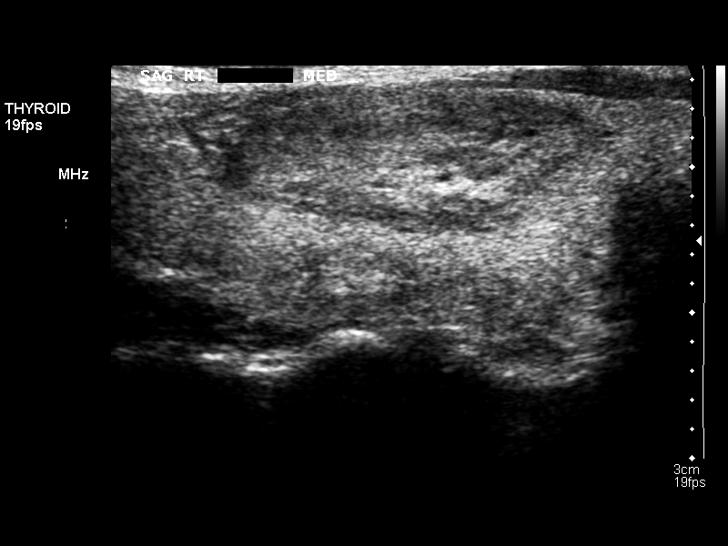
[im 29/44]
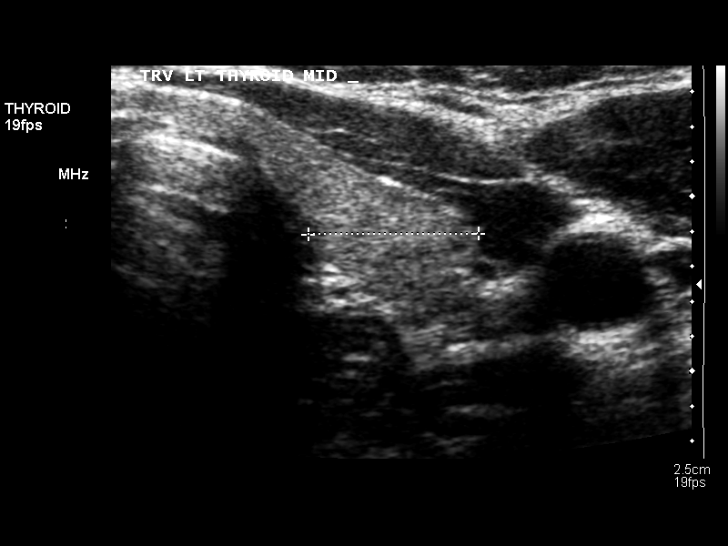
[im 33/44]
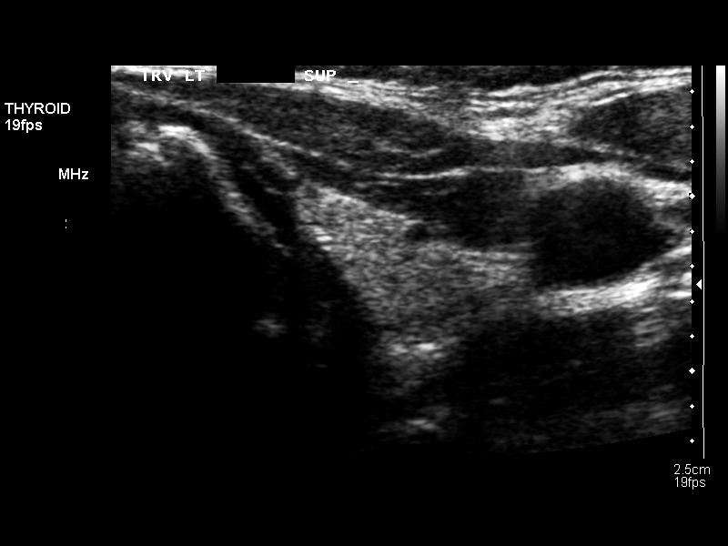
[im 36/44]
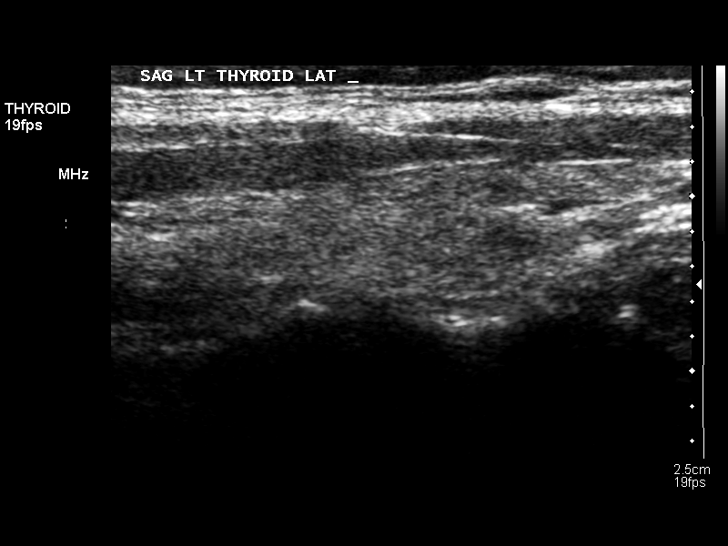
[im 40/44]
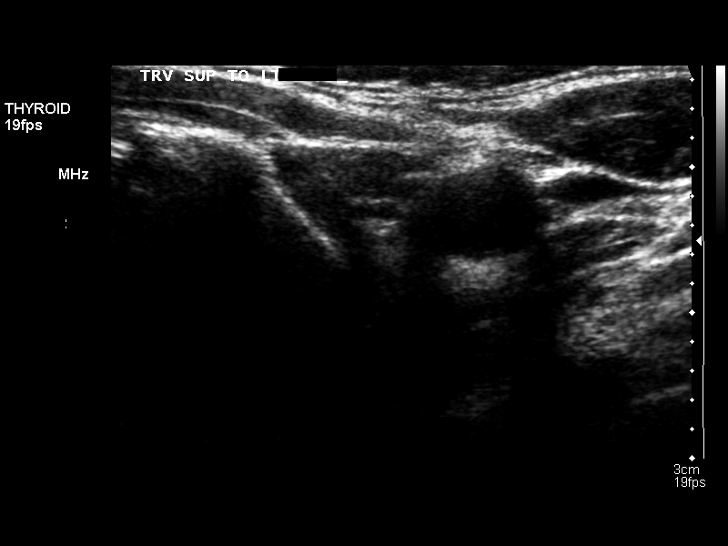
[im 44/44]
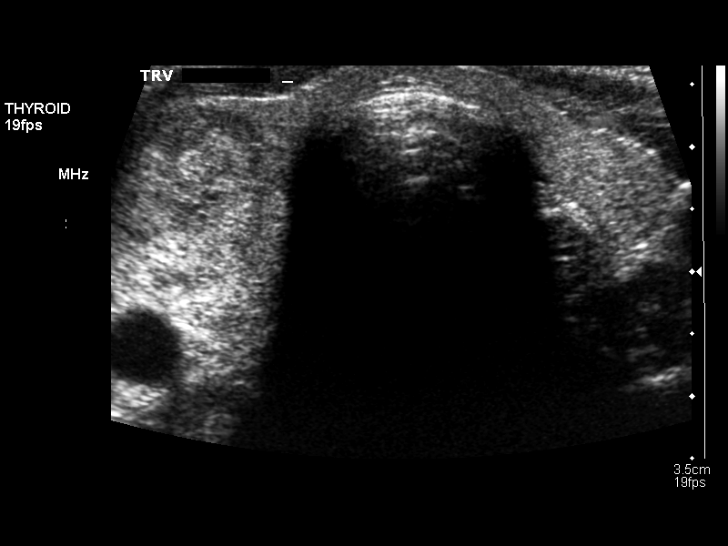

[14 of 25 positions shown; findings below may reference images not displayed]

FINDINGS: Right thyroid lobe:  4.3 x 1.7 x 2.0 cm.
Left thyroid lobe:  3.3 x 0.7 x 1.0 cm.
Isthmus:  3 mm in thickness.

Focal nodules:  The echogenicity of the thyroid gland is relatively
homogeneous.  There is a solid nodule in the right mid lobe of
x 1.2 x 2.7 cm. Findings meet consensus criteria for biopsy.
Ultrasound-guided fine needle aspiration should be considered, as
per the consensus statement: Management of Thyroid Nodules Detected
at US:  Society of Radiologists in Ultrasound Consensus Conference
mid lobe there is an indistinct solid nodule of 1.1 x 0.6 x 0.9 cm.
No other nodule is seen.

Lymphadenopathy:  None visualized.
IMPRESSION: Solid dominant nodule in the right mid lobe of 2.7 cm maximum
diameter.  Consider biopsy.

## 2013-08-10 IMAGING — US US THYROID BIOPSY
1 series · 9 of 9 positions shown · non-contrast
Comparison: Thyroid ultrasound dated 12/05/2011.

CLINICAL DATA: Dominant right thyroid nodule

ULTRASOUND GUIDED NEEDLE ASPIRATE BIOPSY OF THE THYROID GLAND

[Series 1: us thyroid biopsy · 0.07mm/px · 9 acquisitions, 9 frames shown]
[im 1/9]
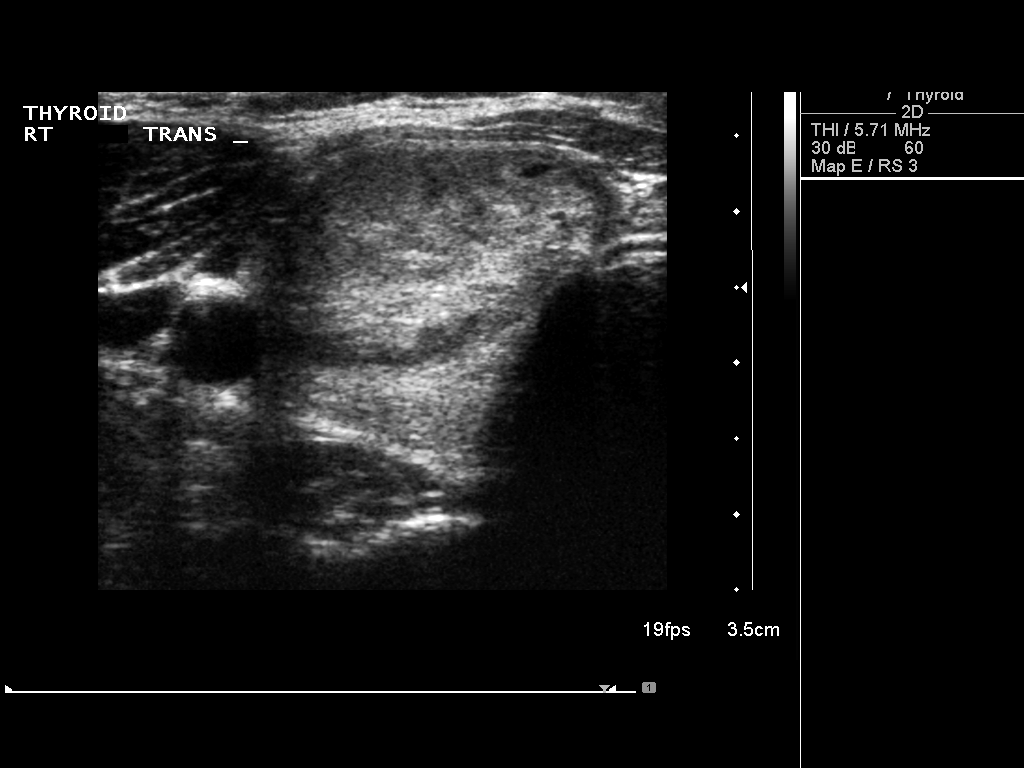
[im 2/9]
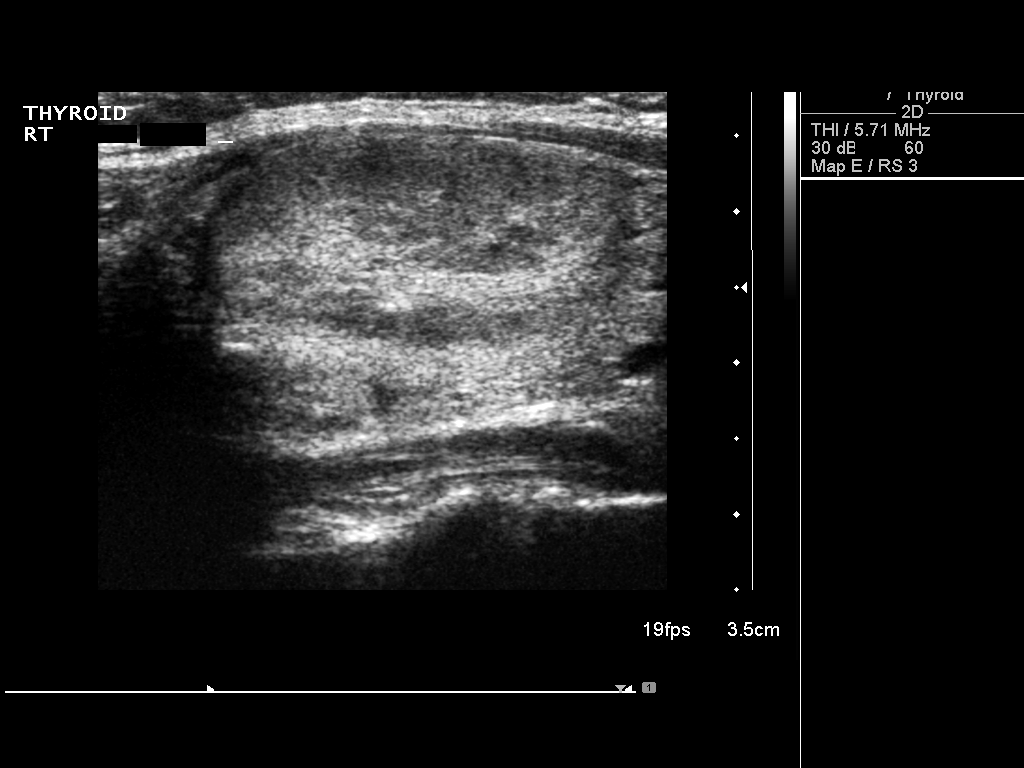
[im 3/9]
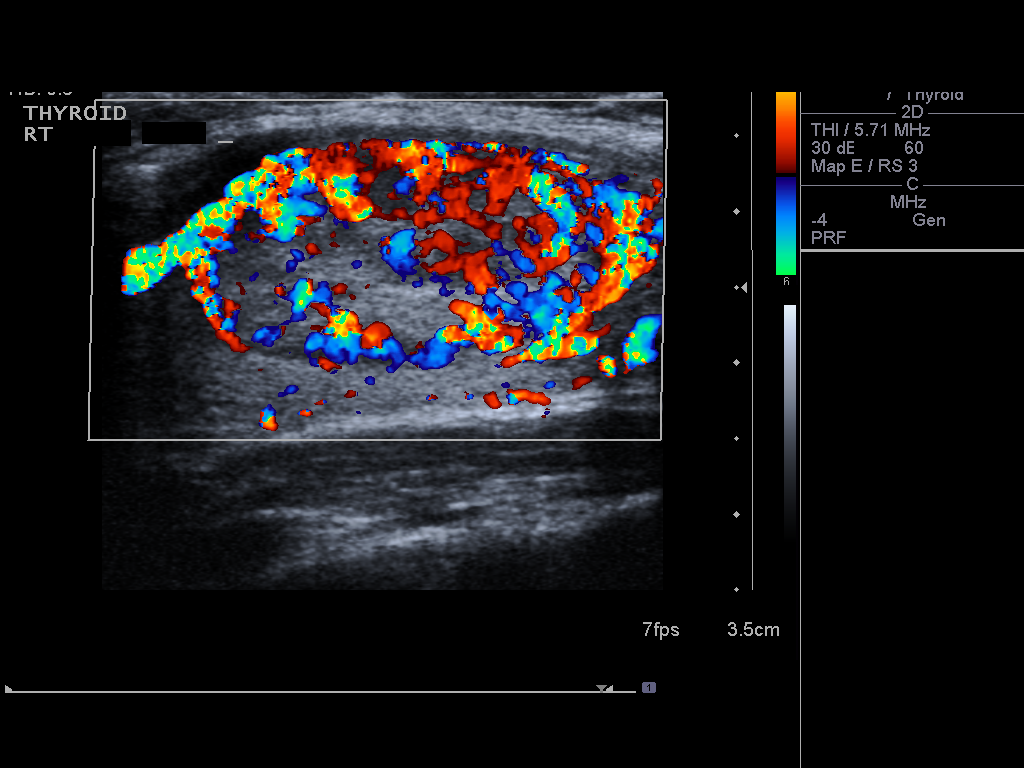
[im 4/9]
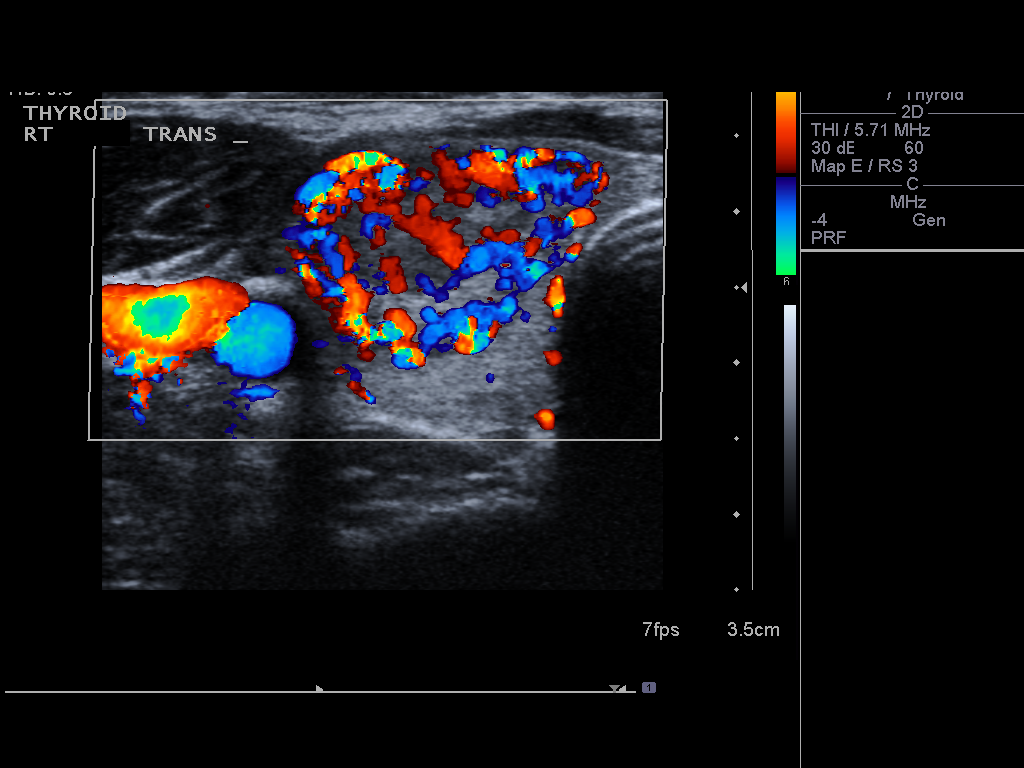
[im 5/9]
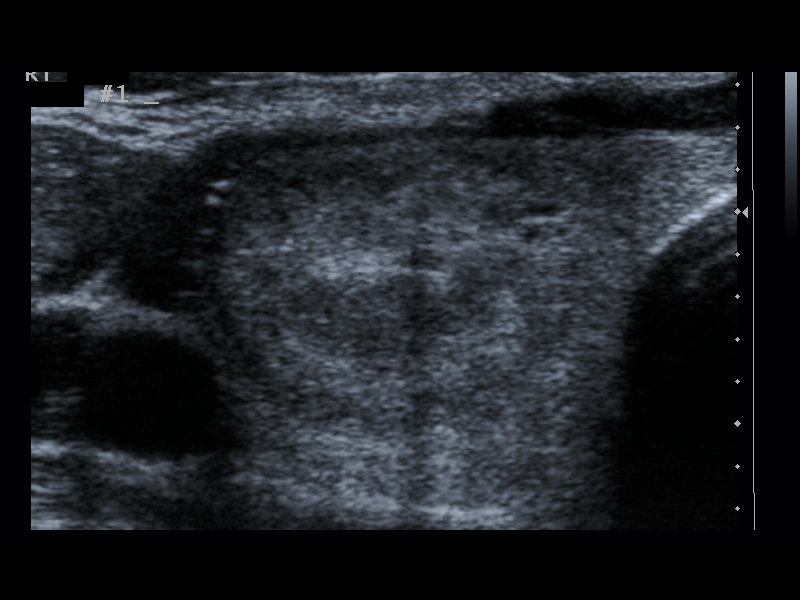
[im 6/9]
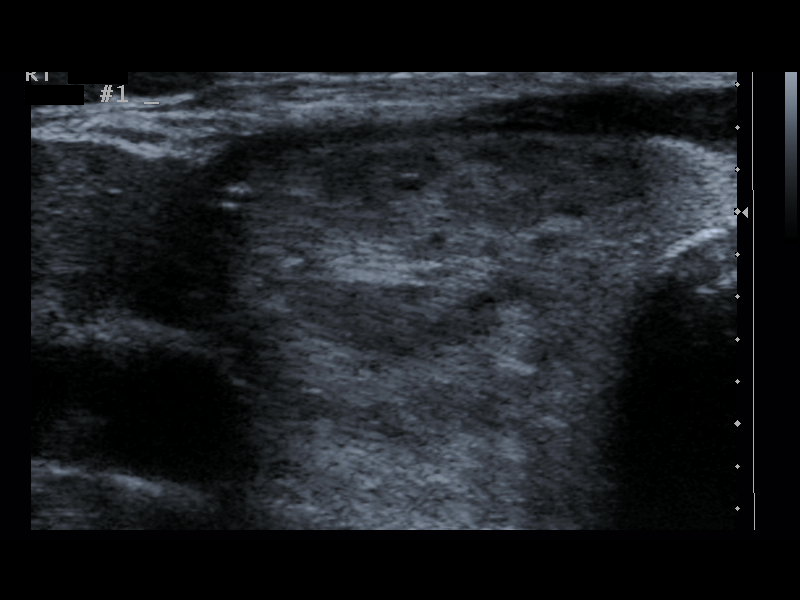
[im 7/9]
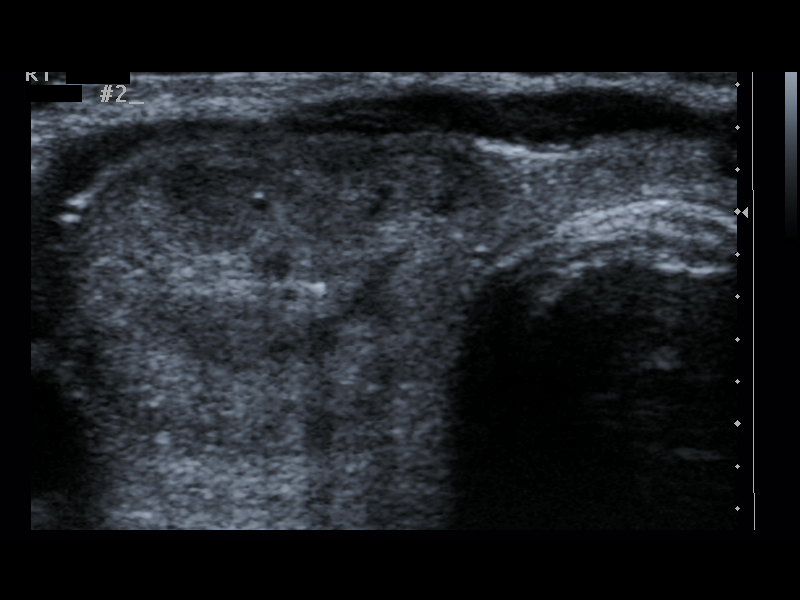
[im 8/9]
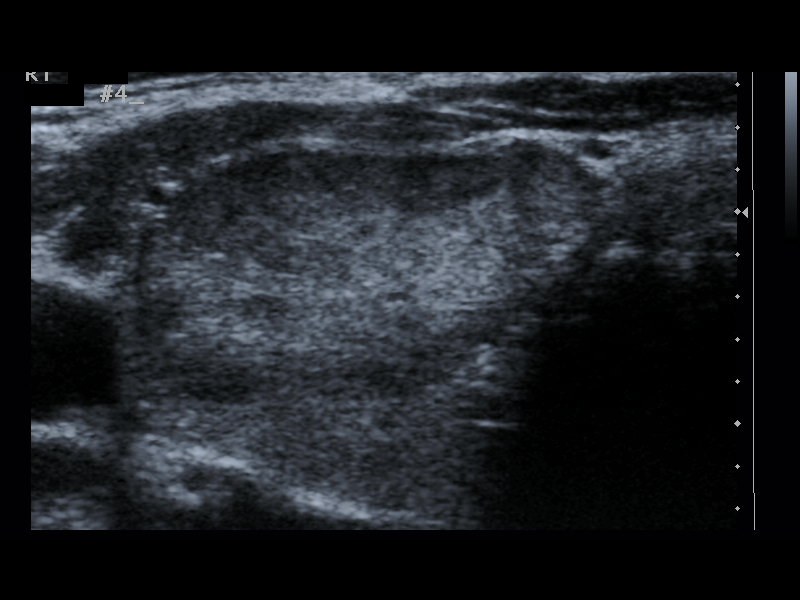
[im 9/9]
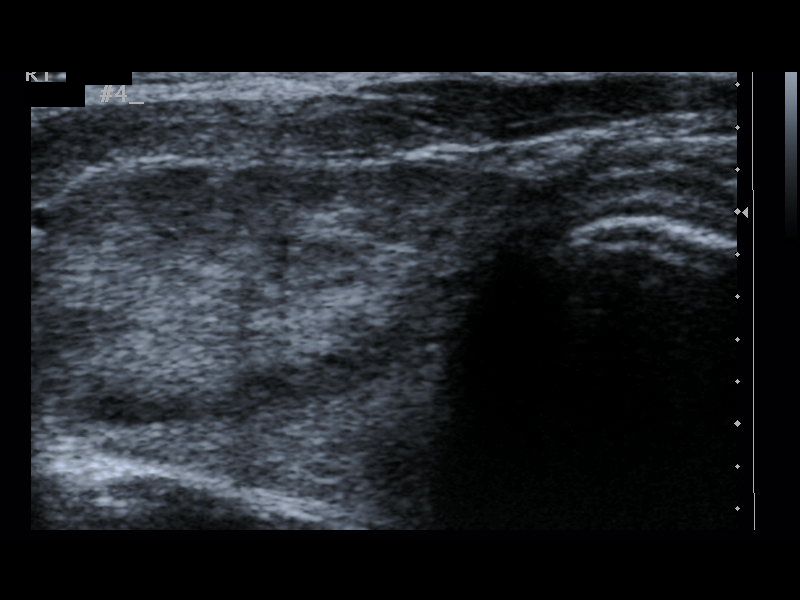

[9 of 9 positions shown; findings below may reference images not displayed]

Thyroid biopsy was thoroughly discussed with the patient and
questions were answered.  The benefits, risks, alternatives, and
complications were also discussed.  The patient understands and
wishes to proceed with the procedure.  Written consent was
obtained.

Ultrasound was performed to localize and mark an adequate site for
the biopsy.  The patient was then prepped and draped in a normal
sterile fashion.  Local anesthesia was provided with 1% lidocaine.
Using direct ultrasound guidance, 4 passes were made using 25 gauge
needles into the nodule within the right lobe of the thyroid.
Ultrasound was used to confirm needle placements on all occasions.
Specimens were sent to Pathology for analysis.

Complications:  None
FINDINGS: Needle passes were made in different portions of the
dominant right thyroid nodule.
IMPRESSION: Ultrasound guided needle aspirate biopsy performed of the right
thyroid nodule.

## 2014-07-06 ENCOUNTER — Encounter (HOSPITAL_COMMUNITY): Payer: Self-pay | Admitting: *Deleted

## 2014-11-20 ENCOUNTER — Other Ambulatory Visit: Payer: Self-pay | Admitting: Obstetrics

## 2014-12-12 ENCOUNTER — Encounter (HOSPITAL_COMMUNITY)
Admission: AD | Disposition: A | Discharge status: Home / Self Care | Payer: Self-pay | Source: Ambulatory Visit | Attending: Obstetrics

## 2014-12-12 ENCOUNTER — Encounter (HOSPITAL_COMMUNITY): Payer: Self-pay | Admitting: *Deleted

## 2014-12-12 ENCOUNTER — Inpatient Hospital Stay (HOSPITAL_COMMUNITY): Payer: 59 | Admitting: Anesthesiology

## 2014-12-12 ENCOUNTER — Inpatient Hospital Stay (HOSPITAL_COMMUNITY)
Admission: AD | Admit: 2014-12-12 | Discharge: 2014-12-15 | DRG: 765 | Disposition: A | Discharge status: Home / Self Care | Payer: 59 | Source: Ambulatory Visit | Attending: Obstetrics | Admitting: Obstetrics

## 2014-12-12 DIAGNOSIS — O9081 Anemia of the puerperium: Secondary | ICD-10-CM | POA: Diagnosis not present

## 2014-12-12 DIAGNOSIS — Z302 Encounter for sterilization: Secondary | ICD-10-CM

## 2014-12-12 DIAGNOSIS — K219 Gastro-esophageal reflux disease without esophagitis: Secondary | ICD-10-CM | POA: Diagnosis present

## 2014-12-12 DIAGNOSIS — O42913 Preterm premature rupture of membranes, unspecified as to length of time between rupture and onset of labor, third trimester: Principal | ICD-10-CM

## 2014-12-12 DIAGNOSIS — O99214 Obesity complicating childbirth: Secondary | ICD-10-CM | POA: Diagnosis present

## 2014-12-12 DIAGNOSIS — E669 Obesity, unspecified: Secondary | ICD-10-CM | POA: Diagnosis present

## 2014-12-12 DIAGNOSIS — Z6830 Body mass index (BMI) 30.0-30.9, adult: Secondary | ICD-10-CM | POA: Diagnosis not present

## 2014-12-12 DIAGNOSIS — O9989 Other specified diseases and conditions complicating pregnancy, childbirth and the puerperium: Secondary | ICD-10-CM | POA: Diagnosis present

## 2014-12-12 DIAGNOSIS — D62 Acute posthemorrhagic anemia: Secondary | ICD-10-CM | POA: Diagnosis not present

## 2014-12-12 DIAGNOSIS — Z98891 History of uterine scar from previous surgery: Secondary | ICD-10-CM

## 2014-12-12 DIAGNOSIS — Z3A35 35 weeks gestation of pregnancy: Secondary | ICD-10-CM | POA: Diagnosis present

## 2014-12-12 DIAGNOSIS — O09523 Supervision of elderly multigravida, third trimester: Secondary | ICD-10-CM | POA: Diagnosis not present

## 2014-12-12 DIAGNOSIS — O3421 Maternal care for scar from previous cesarean delivery: Secondary | ICD-10-CM | POA: Diagnosis present

## 2014-12-12 DIAGNOSIS — O9962 Diseases of the digestive system complicating childbirth: Secondary | ICD-10-CM | POA: Diagnosis present

## 2014-12-12 HISTORY — DX: Preterm premature rupture of membranes, unspecified as to length of time between rupture and onset of labor, third trimester: O42.913

## 2014-12-12 LAB — CBC
HEMATOCRIT: 33.3 % — AB (ref 36.0–46.0)
Hemoglobin: 11.5 g/dL — ABNORMAL LOW (ref 12.0–15.0)
MCH: 30.4 pg (ref 26.0–34.0)
MCHC: 34.5 g/dL (ref 30.0–36.0)
MCV: 88.1 fL (ref 78.0–100.0)
Platelets: 157 10*3/uL (ref 150–400)
RBC: 3.78 MIL/uL — AB (ref 3.87–5.11)
RDW: 13.5 % (ref 11.5–15.5)
WBC: 15.3 10*3/uL — AB (ref 4.0–10.5)

## 2014-12-12 LAB — TYPE AND SCREEN
ABO/RH(D): A POS
Antibody Screen: NEGATIVE

## 2014-12-12 LAB — POCT FERN TEST: POCT FERN TEST: POSITIVE

## 2014-12-12 LAB — GROUP B STREP BY PCR: Group B strep by PCR: POSITIVE — AB

## 2014-12-12 SURGERY — Surgical Case
Anesthesia: Spinal

## 2014-12-12 SURGERY — Surgical Case
Anesthesia: Regional

## 2014-12-12 MED ORDER — NALOXONE HCL 1 MG/ML IJ SOLN: 1.0000 ug/kg/h | INTRAVENOUS | Status: DC | PRN

## 2014-12-12 MED ORDER — MEPERIDINE HCL 25 MG/ML IJ SOLN: 6.2500 mg | INTRAMUSCULAR | Status: DC | PRN

## 2014-12-12 MED ORDER — FENTANYL CITRATE 0.05 MG/ML IJ SOLN
INTRAMUSCULAR | Status: DC | PRN
  Administered 2014-12-12: 25 ug via INTRATHECAL

## 2014-12-12 MED ORDER — NALBUPHINE HCL 10 MG/ML IJ SOLN: 5.0000 mg | Freq: Once | INTRAMUSCULAR | Status: DC | PRN

## 2014-12-12 MED ORDER — KETOROLAC TROMETHAMINE 30 MG/ML IJ SOLN
30.0000 mg | Freq: Four times a day (QID) | INTRAMUSCULAR | Status: AC | PRN
  Administered 2014-12-12: 30 mg via INTRAMUSCULAR

## 2014-12-12 MED ORDER — ACETAMINOPHEN 500 MG PO TABS: 1000.0000 mg | ORAL_TABLET | Freq: Four times a day (QID) | ORAL | Status: DC

## 2014-12-12 MED ORDER — SIMETHICONE 80 MG PO CHEW
80.0000 mg | CHEWABLE_TABLET | ORAL | Status: DC
  Administered 2014-12-14: 80 mg via ORAL
  Filled 2014-12-12 (×2): qty 1

## 2014-12-12 MED ORDER — NALBUPHINE HCL 10 MG/ML IJ SOLN
5.0000 mg | Freq: Once | INTRAMUSCULAR | Status: AC | PRN
Start: 2014-12-12 — End: 2014-12-12
  Filled 2014-12-12: qty 1

## 2014-12-12 MED ORDER — OXYTOCIN 40 UNITS IN LACTATED RINGERS INFUSION - SIMPLE MED: 62.5000 mL/h | INTRAVENOUS | Status: AC

## 2014-12-12 MED ORDER — MORPHINE SULFATE (PF) 0.5 MG/ML IJ SOLN
INTRAMUSCULAR | Status: DC | PRN
  Administered 2014-12-12: .15 mg via INTRATHECAL

## 2014-12-12 MED ORDER — DIPHENHYDRAMINE HCL 25 MG PO CAPS: 25.0000 mg | ORAL_CAPSULE | ORAL | Status: DC | PRN

## 2014-12-12 MED ORDER — LACTATED RINGERS IV SOLN
INTRAVENOUS | Status: DC
  Administered 2014-12-12 (×4): via INTRAVENOUS

## 2014-12-12 MED ORDER — BUPIVACAINE LIPOSOME 1.3 % IJ SUSP
20.0000 mL | Freq: Once | INTRAMUSCULAR | Status: DC
  Filled 2014-12-12: qty 20

## 2014-12-12 MED ORDER — SIMETHICONE 80 MG PO CHEW
80.0000 mg | CHEWABLE_TABLET | Freq: Three times a day (TID) | ORAL | Status: DC
  Administered 2014-12-13 – 2014-12-15 (×7): 80 mg via ORAL
  Filled 2014-12-12 (×8): qty 1

## 2014-12-12 MED ORDER — ONDANSETRON HCL 4 MG/2ML IJ SOLN: 4.0000 mg | Freq: Three times a day (TID) | INTRAMUSCULAR | Status: DC | PRN

## 2014-12-12 MED ORDER — NALBUPHINE HCL 10 MG/ML IJ SOLN
5.0000 mg | Freq: Once | INTRAMUSCULAR | Status: AC | PRN
Start: 2014-12-12 — End: 2014-12-12

## 2014-12-12 MED ORDER — KETOROLAC TROMETHAMINE 30 MG/ML IJ SOLN: 30.0000 mg | Freq: Four times a day (QID) | INTRAMUSCULAR | Status: DC | PRN

## 2014-12-12 MED ORDER — ONDANSETRON HCL 4 MG/2ML IJ SOLN
INTRAMUSCULAR | Status: AC
  Filled 2014-12-12: qty 2

## 2014-12-12 MED ORDER — SODIUM CHLORIDE 0.9 % IJ SOLN: 3.0000 mL | INTRAMUSCULAR | Status: DC | PRN

## 2014-12-12 MED ORDER — PENICILLIN G POTASSIUM 5000000 UNITS IJ SOLR
2.5000 10*6.[IU] | INTRAVENOUS | Status: DC
  Filled 2014-12-12 (×3): qty 2.5

## 2014-12-12 MED ORDER — NALBUPHINE HCL 10 MG/ML IJ SOLN: 5.0000 mg | INTRAMUSCULAR | Status: DC | PRN

## 2014-12-12 MED ORDER — OXYTOCIN 10 UNIT/ML IJ SOLN
40.0000 [IU] | INTRAVENOUS | Status: DC | PRN
  Administered 2014-12-12: 40 [IU] via INTRAVENOUS

## 2014-12-12 MED ORDER — DIPHENHYDRAMINE HCL 50 MG/ML IJ SOLN: 12.5000 mg | INTRAMUSCULAR | Status: DC | PRN

## 2014-12-12 MED ORDER — KETOROLAC TROMETHAMINE 30 MG/ML IJ SOLN
INTRAMUSCULAR | Status: AC
  Filled 2014-12-12: qty 1

## 2014-12-12 MED ORDER — KETOROLAC TROMETHAMINE 30 MG/ML IJ SOLN
30.0000 mg | Freq: Four times a day (QID) | INTRAMUSCULAR | Status: AC | PRN
  Administered 2014-12-13: 30 mg via INTRAVENOUS
  Filled 2014-12-12: qty 1

## 2014-12-12 MED ORDER — FAMOTIDINE IN NACL 20-0.9 MG/50ML-% IV SOLN
20.0000 mg | Freq: Once | INTRAVENOUS | Status: AC
  Administered 2014-12-12: 20 mg via INTRAVENOUS
  Filled 2014-12-12: qty 50

## 2014-12-12 MED ORDER — SIMETHICONE 80 MG PO CHEW: 80.0000 mg | CHEWABLE_TABLET | ORAL | Status: DC | PRN

## 2014-12-12 MED ORDER — MENTHOL 3 MG MT LOZG: 1.0000 | LOZENGE | OROMUCOSAL | Status: DC | PRN

## 2014-12-12 MED ORDER — LANOLIN HYDROUS EX OINT: 1.0000 "application " | TOPICAL_OINTMENT | CUTANEOUS | Status: DC | PRN

## 2014-12-12 MED ORDER — WITCH HAZEL-GLYCERIN EX PADS: 1.0000 "application " | MEDICATED_PAD | CUTANEOUS | Status: DC | PRN

## 2014-12-12 MED ORDER — SCOPOLAMINE 1 MG/3DAYS TD PT72
1.0000 | MEDICATED_PATCH | Freq: Once | TRANSDERMAL | Status: DC
Start: 2014-12-12 — End: 2014-12-15
  Filled 2014-12-12: qty 1

## 2014-12-12 MED ORDER — METOCLOPRAMIDE HCL 5 MG/ML IJ SOLN: 10.0000 mg | Freq: Once | INTRAMUSCULAR | Status: DC | PRN

## 2014-12-12 MED ORDER — BUPIVACAINE HCL (PF) 0.25 % IJ SOLN
INTRAMUSCULAR | Status: DC | PRN
  Administered 2014-12-12: 30 mL

## 2014-12-12 MED ORDER — IBUPROFEN 600 MG PO TABS: 600.0000 mg | ORAL_TABLET | Freq: Four times a day (QID) | ORAL | Status: DC | PRN

## 2014-12-12 MED ORDER — NALBUPHINE HCL 10 MG/ML IJ SOLN
5.0000 mg | INTRAMUSCULAR | Status: DC | PRN
  Administered 2014-12-13: 5 mg via INTRAVENOUS

## 2014-12-12 MED ORDER — IBUPROFEN 600 MG PO TABS
600.0000 mg | ORAL_TABLET | Freq: Four times a day (QID) | ORAL | Status: DC
  Administered 2014-12-13 – 2014-12-15 (×9): 600 mg via ORAL
  Filled 2014-12-12 (×9): qty 1

## 2014-12-12 MED ORDER — FENTANYL CITRATE 0.05 MG/ML IJ SOLN: 25.0000 ug | INTRAMUSCULAR | Status: DC | PRN

## 2014-12-12 MED ORDER — DIPHENHYDRAMINE HCL 25 MG PO CAPS: 25.0000 mg | ORAL_CAPSULE | Freq: Four times a day (QID) | ORAL | Status: DC | PRN

## 2014-12-12 MED ORDER — NALOXONE HCL 0.4 MG/ML IJ SOLN: 0.4000 mg | INTRAMUSCULAR | Status: DC | PRN

## 2014-12-12 MED ORDER — PHENYLEPHRINE 8 MG IN D5W 100 ML (0.08MG/ML) PREMIX OPTIME
INJECTION | INTRAVENOUS | Status: DC | PRN
  Administered 2014-12-12: 60 ug/min via INTRAVENOUS

## 2014-12-12 MED ORDER — CITRIC ACID-SODIUM CITRATE 334-500 MG/5ML PO SOLN
30.0000 mL | Freq: Once | ORAL | Status: AC
  Administered 2014-12-12: 30 mL via ORAL
  Filled 2014-12-12: qty 15

## 2014-12-12 MED ORDER — LACTATED RINGERS IV SOLN
INTRAVENOUS | Status: DC
  Administered 2014-12-13 (×2): via INTRAVENOUS

## 2014-12-12 MED ORDER — SCOPOLAMINE 1 MG/3DAYS TD PT72
MEDICATED_PATCH | TRANSDERMAL | Status: AC
  Filled 2014-12-12: qty 1

## 2014-12-12 MED ORDER — DIBUCAINE 1 % RE OINT: 1.0000 "application " | TOPICAL_OINTMENT | RECTAL | Status: DC | PRN

## 2014-12-12 MED ORDER — ONDANSETRON HCL 4 MG/2ML IJ SOLN
INTRAMUSCULAR | Status: DC | PRN
  Administered 2014-12-12: 4 mg via INTRAVENOUS

## 2014-12-12 MED ORDER — NALOXONE HCL 1 MG/ML IJ SOLN
1.0000 ug/kg/h | INTRAMUSCULAR | Status: DC | PRN
  Filled 2014-12-12: qty 2

## 2014-12-12 MED ORDER — BUPIVACAINE IN DEXTROSE 0.75-8.25 % IT SOLN
INTRATHECAL | Status: DC | PRN
  Administered 2014-12-12: 1.5 mL via INTRATHECAL

## 2014-12-12 MED ORDER — PHENYLEPHRINE 8 MG IN D5W 100 ML (0.08MG/ML) PREMIX OPTIME
INJECTION | INTRAVENOUS | Status: AC
  Filled 2014-12-12: qty 100

## 2014-12-12 MED ORDER — SENNOSIDES-DOCUSATE SODIUM 8.6-50 MG PO TABS
2.0000 | ORAL_TABLET | ORAL | Status: DC
  Administered 2014-12-14 (×2): 2 via ORAL
  Filled 2014-12-12 (×2): qty 2

## 2014-12-12 MED ORDER — ZOLPIDEM TARTRATE 5 MG PO TABS: 5.0000 mg | ORAL_TABLET | Freq: Every evening | ORAL | Status: DC | PRN

## 2014-12-12 MED ORDER — ACETAMINOPHEN 325 MG PO TABS: 650.0000 mg | ORAL_TABLET | ORAL | Status: DC | PRN

## 2014-12-12 MED ORDER — PENICILLIN G POTASSIUM 5000000 UNITS IJ SOLR
5.0000 10*6.[IU] | Freq: Once | INTRAVENOUS | Status: AC
  Administered 2014-12-12: 5 10*6.[IU] via INTRAVENOUS
  Filled 2014-12-12: qty 5

## 2014-12-12 MED ORDER — BUPIVACAINE LIPOSOME 1.3 % IJ SUSP
INTRAMUSCULAR | Status: DC | PRN
  Administered 2014-12-12: 20 mL

## 2014-12-12 MED ORDER — FENTANYL CITRATE 0.05 MG/ML IJ SOLN
INTRAMUSCULAR | Status: AC
  Filled 2014-12-12: qty 2

## 2014-12-12 MED ORDER — PRENATAL MULTIVITAMIN CH
1.0000 | ORAL_TABLET | Freq: Every day | ORAL | Status: DC
  Administered 2014-12-13 – 2014-12-15 (×3): 1 via ORAL
  Filled 2014-12-12 (×3): qty 1

## 2014-12-12 MED ORDER — MORPHINE SULFATE 0.5 MG/ML IJ SOLN
INTRAMUSCULAR | Status: AC
  Filled 2014-12-12: qty 10

## 2014-12-12 MED ORDER — TETANUS-DIPHTH-ACELL PERTUSSIS 5-2.5-18.5 LF-MCG/0.5 IM SUSP: 0.5000 mL | Freq: Once | INTRAMUSCULAR | Status: DC

## 2014-12-12 MED ORDER — OXYCODONE-ACETAMINOPHEN 5-325 MG PO TABS
1.0000 | ORAL_TABLET | ORAL | Status: DC | PRN
  Administered 2014-12-13 – 2014-12-15 (×6): 1 via ORAL
  Filled 2014-12-12 (×7): qty 1

## 2014-12-12 MED ORDER — SCOPOLAMINE 1 MG/3DAYS TD PT72
MEDICATED_PATCH | TRANSDERMAL | Status: DC | PRN
  Administered 2014-12-12: 1 via TRANSDERMAL

## 2014-12-12 MED ORDER — CEFAZOLIN SODIUM-DEXTROSE 2-3 GM-% IV SOLR
INTRAVENOUS | Status: DC | PRN
  Administered 2014-12-12: 2 g via INTRAVENOUS

## 2014-12-12 MED ORDER — OXYCODONE-ACETAMINOPHEN 5-325 MG PO TABS: 2.0000 | ORAL_TABLET | ORAL | Status: DC | PRN

## 2014-12-12 SURGICAL SUPPLY — 38 items
BARRIER ADHS 3X4 INTERCEED (GAUZE/BANDAGES/DRESSINGS) ×2 IMPLANT
CLAMP CORD UMBIL (MISCELLANEOUS) IMPLANT
CLOTH BEACON ORANGE TIMEOUT ST (SAFETY) ×2 IMPLANT
CONTAINER PREFILL 10% NBF 15ML (MISCELLANEOUS) IMPLANT
DRAPE SHEET LG 3/4 BI-LAMINATE (DRAPES) IMPLANT
DRSG OPSITE POSTOP 4X10 (GAUZE/BANDAGES/DRESSINGS) ×2 IMPLANT
DURAPREP 26ML APPLICATOR (WOUND CARE) ×2 IMPLANT
ELECT REM PT RETURN 9FT ADLT (ELECTROSURGICAL) ×2
ELECTRODE REM PT RTRN 9FT ADLT (ELECTROSURGICAL) ×1 IMPLANT
EXTRACTOR VACUUM M CUP 4 TUBE (SUCTIONS) IMPLANT
GLOVE BIO SURGEON STRL SZ 6.5 (GLOVE) ×2 IMPLANT
GLOVE BIOGEL PI IND STRL 7.0 (GLOVE) ×1 IMPLANT
GLOVE BIOGEL PI INDICATOR 7.0 (GLOVE) ×1
GOWN STRL REUS W/TWL LRG LVL3 (GOWN DISPOSABLE) ×4 IMPLANT
KIT ABG SYR 3ML LUER SLIP (SYRINGE) IMPLANT
LIQUID BAND (GAUZE/BANDAGES/DRESSINGS) ×2 IMPLANT
NEEDLE HYPO 22GX1.5 SAFETY (NEEDLE) IMPLANT
NEEDLE HYPO 25X5/8 SAFETYGLIDE (NEEDLE) IMPLANT
NS IRRIG 1000ML POUR BTL (IV SOLUTION) ×2 IMPLANT
PACK C SECTION WH (CUSTOM PROCEDURE TRAY) ×2 IMPLANT
PAD ABD 7.5X8 STRL (GAUZE/BANDAGES/DRESSINGS) ×2 IMPLANT
PAD OB MATERNITY 4.3X12.25 (PERSONAL CARE ITEMS) ×2 IMPLANT
SPONGE GAUZE 4X4 12PLY STER LF (GAUZE/BANDAGES/DRESSINGS) ×2 IMPLANT
STAPLER VISISTAT 35W (STAPLE) IMPLANT
STRIP CLOSURE SKIN 1/2X4 (GAUZE/BANDAGES/DRESSINGS) IMPLANT
SUT MON AB 4-0 PS1 27 (SUTURE) ×2 IMPLANT
SUT PLAIN 0 NONE (SUTURE) IMPLANT
SUT PLAIN 2 0 (SUTURE) ×1
SUT PLAIN 2 0 XLH (SUTURE) IMPLANT
SUT PLAIN ABS 2-0 CT1 27XMFL (SUTURE) ×1 IMPLANT
SUT VIC AB 0 CT1 36 (SUTURE) ×6 IMPLANT
SUT VIC AB 0 CTX 36 (SUTURE) ×3
SUT VIC AB 0 CTX36XBRD ANBCTRL (SUTURE) ×3 IMPLANT
SUT VIC AB 2-0 CT1 27 (SUTURE) ×1
SUT VIC AB 2-0 CT1 TAPERPNT 27 (SUTURE) ×1 IMPLANT
SYR CONTROL 10ML LL (SYRINGE) IMPLANT
TOWEL OR 17X24 6PK STRL BLUE (TOWEL DISPOSABLE) ×2 IMPLANT
TRAY FOLEY CATH SILVER 14FR (SET/KITS/TRAYS/PACK) IMPLANT

## 2014-12-12 NOTE — Transfer of Care (Signed)
Immediate Anesthesia Transfer of Care Note  Patient: Kaitlyn Daniels  Procedure(s) Performed: Procedure(s): CESAREAN SECTION WITH BILATERAL TUBAL LIGATION (N/A)  Patient Location: PACU  Anesthesia Type:Spinal and Epidural  Level of Consciousness: awake  Airway & Oxygen Therapy: Patient Spontanous Breathing  Post-op Assessment: Report given to RN and Post -op Vital signs reviewed and stable  Post vital signs: stable  Last Vitals:  Filed Vitals:   12/12/14 1404  BP: 112/76  Pulse: 100  Temp: 36.6 C  Resp: 18    Complications: No apparent anesthesia complications

## 2014-12-12 NOTE — Anesthesia Postprocedure Evaluation (Signed)
  Anesthesia Post-op Note  Patient: Kaitlyn Daniels  Procedure(s) Performed: Procedure(s): CESAREAN SECTION WITH BILATERAL TUBAL LIGATION (N/A)  Patient Location: PACU  Anesthesia Type:Spinal  Level of Consciousness: awake, alert  and oriented  Airway and Oxygen Therapy: Patient Spontanous Breathing  Post-op Pain: none  Post-op Assessment: Post-op Vital signs reviewed, Patient's Cardiovascular Status Stable, Respiratory Function Stable, Patent Airway, No signs of Nausea or vomiting, Pain level controlled, No headache, No backache, No residual numbness and No residual motor weakness  Post-op Vital Signs: Reviewed and stable  Last Vitals:  Filed Vitals:   12/12/14 2000  BP: 118/54  Pulse: 77  Temp: 36.7 C  Resp: 19    Complications: No apparent anesthesia complications

## 2014-12-12 NOTE — H&P (Signed)
ARIABELLA BRIEN is a 34 y.o. [id number] at [redacted]w[redacted]d presenting for ROM. Pt notes no contractions. Good fetal movement, No vaginal bleeding, started leaking fluid at noon, shortly after eating lunch.  [facility] at [place] since 7 wks - h/o PTD, G1- term, G2- PTD after SROM at 35 wks. Pt declined 17-P - ho c/s x 2, dense adhesions in 2nd c/s, pt aware surgical risks - desires TL - failed 1 hr GTT, nl 3 hr GTT   Prenatal Transfer Tool  Maternal Diabetes: No Genetic Screening: Normal Maternal Ultrasounds/Referrals: Normal Fetal Ultrasounds or other Referrals:  None Maternal Substance Abuse:  No Significant Maternal Medications:  None Significant Maternal Lab Results: None     OB History    Gravida Para Term Preterm AB TAB SAB Ectopic Multiple Living   3 2 1 1      2      Past Medical History  Diagnosis Date  . No pertinent past medical history   . Acute blood loss anemia 05/15/2011  . Arthritis   . Preterm premature rupture of membranes in third trimester 12/12/2014   Past Surgical History  Procedure Laterality Date  . Cesarean section  05/13/2011    Procedure: CESAREAN SECTION;  Surgeon: Elveria Royals;  Location: [facility];  Service: Gynecology;  Laterality: N/A;  Primary cesarean section with delivery of baby boy at 24. Apgars 4/8.  Marland Kitchen Cesarean section N/A 04/05/2013    Procedure: Repeat cesarean section with delivery of baby boy at [place].;  Surgeon: Elveria Royals, MD;  Location: [facility];  Service: Obstetrics;  Laterality: N/A;   Family History: family history includes Cancer in her maternal aunt and paternal grandfather. Social History:  reports that she has never smoked. She does not have any smokeless tobacco history on file. She reports that she drinks alcohol. She reports that she does not use illicit drugs.  Review of Systems - Negative except leakage of fluid     Blood pressure 112/76, pulse 100, temperature 97.8 F (36.6 C), temperature source Oral, resp. rate 18, height  5\' 3"  (1.6 m), weight 77.837 kg (171 lb 9.6 oz), unknown if currently breastfeeding.  Physical Exam:  Exam by R. Renato Battles with confirmation of ROM, vtx presentation   Prenatal labs: ABO, Rh:  A+ Antibody:  neg Rubella:  immune RPR:   NR HBsAg:   neg HIV:   neg GBS:   not done 1 hr Glucola 142,nl 3 hr GTT  Genetic screening nl Informaseq,nl AFP Anatomy US nl   Assessment/Plan: 34 y.o. [id number] at [redacted]w[redacted]d SROM, planning RCS. Will move to RCS once 6-8 hrs NPO, sooner if pt starts to labor or shows any signs of maternal or fetal distress - surgical risks reviewed w/ pt - tubal ligation, pt confirms desire for no further pregnacy   Ludene Stokke A. 12/12/2014, 4:34 PM

## 2014-12-12 NOTE — MAU Provider Note (Signed)
History     CSN: [id number]  Arrival date and time: 12/12/14 1336  Orders placed in EPIC: 1412 Provider notified: 1417 Provider at bedside: 1430     Chief Complaint  Patient presents with  . Rupture of Membranes   HPI  [name] is a 34 yo G3P1102 female at 35.[redacted] wks gestation presents today with complaints of leaking clear vaginal fluid since 1215 today.  She denies contractions or vaginal bleeding.  (+) FM. Scheduled for cesarean delivery with bilateral tubal ligation on 01/11/2014.   Past Medical History  Diagnosis Date  . No pertinent past medical history   . Acute blood loss anemia 05/15/2011  . Arthritis   . Preterm premature rupture of membranes in third trimester 12/12/2014    Past Surgical History  Procedure Laterality Date  . Cesarean section  05/13/2011    Procedure: CESAREAN SECTION;  Surgeon: Elveria Royals;  Location: [facility];  Service: Gynecology;  Laterality: N/A;  Primary cesarean section with delivery of baby boy at 26. Apgars 4/8.  Marland Kitchen Cesarean section N/A 04/05/2013    Procedure: Repeat cesarean section with delivery of baby boy at [place].;  Surgeon: Elveria Royals, MD;  Location: [facility];  Service: Obstetrics;  Laterality: N/A;    Family History  Problem Relation Age of Onset  . Cancer Maternal Aunt   . Cancer Paternal Grandfather     History  Substance Use Topics  . Smoking status: Never Smoker   . Smokeless tobacco: Not on file  . Alcohol Use: Yes     Comment: occasionally    Allergies: No Known Allergies  Prescriptions prior to admission  Medication Sig Dispense Refill Last Dose  . calcium carbonate (TUMS - DOSED IN MG ELEMENTAL CALCIUM) 500 MG chewable tablet Chew 1-2 tablets by mouth daily as needed for indigestion or heartburn.   12/12/2014 at Unknown time  . Prenatal Vit-Fe Fumarate-FA (PRENATAL MULTIVITAMIN) TABS Take 1 tablet by mouth daily at 12 noon.   12/11/2014 at Unknown time  . ibuprofen (ADVIL,MOTRIN) 600 MG tablet Take 1 tablet  (600 mg total) by mouth every 6 (six) hours. (Patient not taking: Reported on 12/12/2014) 30 tablet 0   . oxyCODONE-acetaminophen (PERCOCET/ROXICET) 5-325 MG per tablet Take 1-2 tablets by mouth every 4 (four) hours as needed. (Patient not taking: Reported on 12/12/2014) 30 tablet 0     Review of Systems  Constitutional: Negative.   HENT: Negative.   Eyes: Negative.   Respiratory: Negative.   Cardiovascular: Negative.   Gastrointestinal: Negative.   Genitourinary:       Leaking  Clear fluid since 1215 today  Musculoskeletal: Negative.   Skin: Negative.   Neurological: Negative.   Endo/Heme/Allergies: Negative.   Psychiatric/Behavioral: Negative.    Results for orders placed or performed during the hospital encounter of 12/12/14 (from the past 24 hour(s))  POCT fern test     Status: None   Collection Time: 12/12/14  2:18 PM  Result Value Ref Range   POCT Fern Test Positive = ruptured amniotic membanes   CBC     Status: Abnormal   Collection Time: 12/12/14  3:35 PM  Result Value Ref Range   WBC 15.3 (H) 4.0 - 10.5 K/uL   RBC 3.78 (L) 3.87 - 5.11 MIL/uL   Hemoglobin 11.5 (L) 12.0 - 15.0 g/dL   HCT 33.3 (L) 36.0 - 46.0 %   MCV 88.1 78.0 - 100.0 fL   MCH 30.4 26.0 - 34.0 pg   MCHC 34.5  30.0 - 36.0 g/dL   RDW 13.5 11.5 - 15.5 %   Platelets 157 150 - 400 K/uL  Type and screen     Status: None   Collection Time: 12/12/14  3:35 PM  Result Value Ref Range   ABO/RH(D) A POS    Antibody Screen NEG    Sample Expiration 12/15/2014   Group B strep by PCR     Status: Abnormal   Collection Time: 12/12/14  4:05 PM  Result Value Ref Range   Group B strep by PCR POSITIVE (A) NEGATIVE   CEFM FHR: 140 bpm / moderate variability / accels present / no decels TOCO: Irregular contractions  Physical Exam   Blood pressure 112/76, pulse 100, temperature 97.8 F (36.6 C), temperature source Oral, resp. rate 18, height 5\' 3"  (1.6 m), weight 77.837 kg (171 lb 9.6 oz).  Physical Exam   Constitutional: She is oriented to person, place, and time. She appears well-developed and well-nourished.  HENT:  Head: Normocephalic.  Eyes: Pupils are equal, round, and reactive to light.  Neck: Normal range of motion. Neck supple.  Cardiovascular: Normal rate, regular rhythm, normal heart sounds and intact distal pulses.   Respiratory: Effort normal and breath sounds normal.  GI: Soft. Bowel sounds are normal.  Last ate at 1200 pm  Genitourinary:  (+) fern; mild UC palpated - pt unaware; VE: FT/90/-2; vtx by Curlene Labrum maneuvers and informal bedside U/S  Musculoskeletal: Normal range of motion.  Neurological: She is alert and oriented to person, place, and time. She has normal reflexes.  Skin: Skin is warm and dry.  Psychiatric: She has a normal mood and affect. Her behavior is normal. Judgment and thought content normal.    MAU Course  Procedures Fern slide CEFM Assessment and Plan  G3P1102 / SIUP at 35.[redacted] wks gestation PPROM Category 1 FHR tracing H/O PTD @ 35.[redacted] wks gestation H/O Previous Cesarean Delivery  Prep for unscheduled repeat cesarean delivery with bilateral tubal ligation LR 1000 mg @ 125 ml/hr Continue CEFM Send rapid GBS / PCN G 5 million units now, then 2 million units every 4 hours IVPB  Dr. Pamala Hurry notified of assessment / here to discuss need for cesarean delivery today instead of when scheduled, notified patient and spouse that surgery cannot occur before 8 pm (unless contractioning) d/t anesthesia request   Laury Deep, Jerilynn Mages MSN, CNM 12/12/2014, 2:30 PM

## 2014-12-12 NOTE — Anesthesia Preprocedure Evaluation (Addendum)
Anesthesia Evaluation  Patient identified by MRN, date of birth, ID band Patient awake    Reviewed: Allergy & Precautions, NPO status , Patient's Chart, lab work & pertinent test results  Airway Mallampati: II  TM Distance: >3 FB Neck ROM: Full    Dental no notable dental hx. (+) Teeth Intact   Pulmonary neg pulmonary ROS,  breath sounds clear to auscultation  Pulmonary exam normal       Cardiovascular negative cardio ROS  Rhythm:Regular Rate:Normal     Neuro/Psych negative neurological ROS  negative psych ROS   GI/Hepatic Neg liver ROS, GERD-  Medicated and Controlled,  Endo/Other  Obesity  Renal/GU negative Renal ROS  negative genitourinary   Musculoskeletal  (+) Arthritis -,   Abdominal (+) + obese,   Peds  Hematology  (+) anemia ,   Anesthesia Other Findings   Reproductive/Obstetrics (+) Pregnancy PPROM 35 weeks AOG Previous C/Section x 2 Desires sterilization                            Anesthesia Physical Anesthesia Plan  ASA: II  Anesthesia Plan: Combined Spinal and Epidural   Post-op Pain Management:    Induction:   Airway Management Planned: Natural Airway  Additional Equipment:   Intra-op Plan:   Post-operative Plan:   Informed Consent: I have reviewed the patients History and Physical, chart, labs and discussed the procedure including the risks, benefits and alternatives for the proposed anesthesia with the patient or authorized representative who has indicated his/her understanding and acceptance.     Plan Discussed with: Anesthesiologist  Anesthesia Plan Comments:         Anesthesia Quick Evaluation

## 2014-12-12 NOTE — Anesthesia Procedure Notes (Signed)
Spinal Patient location during procedure: OR Start time: 12/12/2014 6:12 PM Staffing Anesthesiologist: Josephine Igo Performed by: anesthesiologist  Preanesthetic Checklist Completed: patient identified, site marked, surgical consent, pre-op evaluation, timeout performed, IV checked, risks and benefits discussed and monitors and equipment checked Spinal Block Patient position: sitting Prep: site prepped and draped and DuraPrep Patient monitoring: cardiac monitor, continuous pulse ox, blood pressure and heart rate Approach: midline Location: L3-4 Injection technique: catheter Needle Needle type: Tuohy and Sprotte  Needle gauge: 24 G Needle length: 12.7 cm Needle insertion depth: 4 cm Catheter type: closed end flexible Catheter size: 19 g Catheter at skin depth: 9 cm Assessment Sensory level: T4 Additional Notes Epidural performed as above. SAB performed thru epidural needle. CSF clear, free flow, no paresthesias. Rx injected and spinal needle withdrawn. Epidural catheter threaded 5 cm into epidural space.Epidural needle withdrawn and sterile dressing applied. Patient placed supine with LUD. Patient tolerated procedure well. Adequate sensory level.

## 2014-12-12 NOTE — Lactation Note (Signed)
This note was copied from the chart of [place]. Lactation Consultation Note  Patient Name: Kaitlyn Daniels [id number] Date: 12/12/2014 Reason for consult: Initial assessment;Late preterm infant born at 53 weeks but weight above 6 pounds.  This is mom's third baby and her other babies each breastfed 1 year.  One of the older children was born at 65 weeks but breastfed well.  RN's Jerrye Beavers and Hassan Rowan, have reviewed LPI guidelines for feedings.  Baby tired out when latched to breast with initial LATCH score=6, then 7 about 30 minuteswas just being fed alimentum (10 ml's) by Jerrye Beavers.  RN, Hassan Rowan placed DEBP in room and mom will initiate pumping by 12 midnight and pump q3h along with hand expression and latch attempts.  LC encouraged STS.  Mom states that she knows how to hand express her milk.  Snappies provided and marty, RN has demonstrated assembly of pump.  RN, Hassan Rowan states she will assist mom with initial pumping before 6 hours and assist as needed. Baby to be fed q3h.  Mom encouraged to feed baby 8-12 times/24 hours and with feeding cues. LC encouraged review of Baby and Me pp 9, 14 and 20-25 for STS and BF information. LC provided [employer] brochure and reviewed [facility] services and list of community and web site resources.    Maternal Data Formula Feeding for Exclusion: No Has patient been taught Hand Expression?: Yes (mom says she knows how to hand express her milk) Does the patient have breastfeeding experience prior to this delivery?: Yes  Feeding Feeding Type: Breast Fed Length of feed: 10 min  LATCH Score/Interventions Latch: Grasps breast easily, tongue down, lips flanged, rhythmical sucking. Intervention(s): Adjust position;Assist with latch  Audible Swallowing: None Intervention(s): Skin to skin  Type of Nipple: Everted at rest and after stimulation  Comfort (Breast/Nipple): Soft / non-tender     Hold (Positioning): Assistance needed to correctly position infant at breast  and maintain latch.  LATCH Score: 7 (feeding assessment a few hours ago)  Lactation Tools Discussed/Used Pump Review: Milk Storage Initiated by:: placed at bedside by RN; mom to start by 1200 midnight Date initiated:: 12/12/14 STS, DEBP, hand expression  Consult Status Consult Status: Follow-up Date: 12/13/14 Follow-up type: In-patient    [name] [facility] 12/12/2014, 10:25 PM

## 2014-12-12 NOTE — Brief Op Note (Signed)
12/12/2014  7:52 PM  PATIENT:  Kaitlyn Daniels  34 y.o. female  PRE-OPERATIVE DIAGNOSIS:  Prolonged Premature Rupture of Membranes; third trimester, undesired fertility  POST-OPERATIVE DIAGNOSIS:  Premature rupture of membranes for repeat cesarean section and bitubal ligation for sterilization  PROCEDURE:  Procedure(s): CESAREAN SECTION WITH BILATERAL TUBAL LIGATION (N/A) LTCS with 2 layer closure  SURGEON:  Surgeon(s) and Role:    * Aloha Gell, MD - Primary  PHYSICIAN ASSISTANT:   ASSISTANTS: Laury Deep, CNM   ANESTHESIA:   Spinal, local  EBL:  Total I/O In: 500 [I.V.:500] Out: 1500 [Urine:700; Blood:800]  BLOOD ADMINISTERED:none  DRAINS: Urinary Catheter (Foley)   LOCAL MEDICATIONS USED:  OTHER 20 cc Exparel mixed with 20cc 1/4 % marcaine  SPECIMEN:  Source of Specimen:  bilateral fallopian tubes  DISPOSITION OF SPECIMEN:  PATHOLOGY  COUNTS:  YES  TOURNIQUET:  * No tourniquets in log *  DICTATION: .Note written in EPIC  PLAN OF CARE: Admit to inpatient   PATIENT DISPOSITION:  PACU - hemodynamically stable.   Delay start of Pharmacological VTE agent (>24hrs) due to surgical blood loss or risk of bleeding: yes

## 2014-12-12 NOTE — Op Note (Signed)
12/12/2014  7:52 PM  PATIENT:  Kaitlyn Daniels  34 y.o. female  PRE-OPERATIVE DIAGNOSIS:  Prolonged Premature Rupture of Membranes; third trimester, undesired fertility  POST-OPERATIVE DIAGNOSIS:  Premature rupture of membranes for repeat cesarean section and bitubal ligation for sterilization  PROCEDURE:  Procedure(s): CESAREAN SECTION WITH BILATERAL TUBAL LIGATION (N/A) LTCS with 2 layer closure  SURGEON:  Surgeon(s) and Role:    * Aloha Gell, MD - Primary  PHYSICIAN ASSISTANT:   ASSISTANTS: Laury Deep, CNM   ANESTHESIA:   Spinal, local  EBL:  Total I/O In: 500 [I.V.:500] Out: 1500 [Urine:700; Blood:800]  BLOOD ADMINISTERED:none  DRAINS: Urinary Catheter (Foley)   LOCAL MEDICATIONS USED:  OTHER 20 cc Exparel mixed with 20cc 1/4 % marcaine  SPECIMEN:  Source of Specimen:  bilateral fallopian tubes  DISPOSITION OF SPECIMEN:  PATHOLOGY  COUNTS:  YES  TOURNIQUET:  * No tourniquets in log *  DICTATION: .Note written in EPIC  PLAN OF CARE: Admit to inpatient   PATIENT DISPOSITION:  PACU - hemodynamically stable.   Delay start of Pharmacological VTE agent (>24hrs) due to surgical blood loss or risk of bleeding: yes   Findings:  @BABYSEXEBC @ infant,  APGAR (1 MIN): 8   APGAR (5 MINS): 9   APGAR (10 MINS):   Normal uterus, tubes and ovaries, normal placenta. 3VC, clear amniotic fluid  EBL: per anasthesia Antibiotics:   2g Ancef Complications: none  Indications: This is a 34 y.o. year-old, G3P1102  At [redacted]w[redacted]d admitted for SROM at 35 wks, Given 2 prior c/s, plan for RCS with TL. Risks benefits and alternatives of the procedure were discussed with the patient who agreed to proceed  Procedure:  After informed consent was obtained the patient was taken to the operating room where spinal anesthesia was intiated.  She was prepped and draped in the normal sterile fashion in dorsal supine position with a leftward tilt.  A foley catheter was placed then vaginal preg  was done.  A Pfannenstiel skin incision was made 2 cm above the pubic symphysis in the midline with the scalpel over the prior incisions.  Dissection was carried down with the Bovie cautery until the fascia was reached. Very thick fascial tissue was noted. The fascia was incised in the midline. The incision was extended laterally with the Mayo scissors. The inferior aspect of the fascial incision was grasped with the Coker clamps, elevated up and the underlying rectus muscles were dissected off sharply. The superior aspect of the fascial incision was grasped with the Coker clamps elevated up and the underlying rectus muscles were dissected off sharply.  Very thick adhesions were noted, and the fascial plane was difficult to identify. A buttonhole in the fascia was incidentally created and closed at the end of the case with a figure of 8 of 0-vicryl. If became clear the fascia and peritoneum were scarred together. This scarred tissue was lifted cephalad and a scalpel was used to sharply enter the peritoneum. The peritoneal incision was extended superiorly and inferiorly with good visualization of the bladder.  Bovie cautery was used to extend the incision about 2 cm into the right rectal muscle due to dense adhesions.  Hemostasis was noted. The bladder blade was inserted and palpation was done to assess the fetal position and the location of the uterine vessels. The bladder flap was dissected inferiorly.  The lower segment of the uterus was incised sharply with the scalpel and extended  bluntly in the cephalo-caudal fashion. Bandage scissors ere used to  extend the incision. The infant was grasped, brought to the incision,  rotated and the infant was delivered with fundal pressure. The nose and mouth were bulb suctioned. The cord was clamped and cut. The infant was handed off to the waiting pediatrician. The placenta was expressed. The uterus was not able to be exteriorized due to limited stretch of the peritoneal  incision. The uterus was left in situ, cleared of all clots and debris. T clamps placed on teh edges on the incision. The uterine incision was repaired with 0 Vicryl in a running locked fashion.  A second layer of the same suture was used in an imbricating fashion to obtain excellent hemostasis. Several additional figure of 8 sutures placed in the midpoint of the incision. The gutters were cleared of all clots and debris. The uterine incision was reinspected and found to be hemostatic. The tubes and ovaries were evaluated and found to be normal. The left fallopian tube was carried out to its fimbriated end, a Babcock placed in the mid-isthmic portion and a 2 cm knuckle of tube was tied off with plain gut x 2 and the tubal was transected. Hemostasis was noted and the tube was returned to the pelvis. An identical procedure was carried out on the right side. A piece of interceed was placed horizontally over the uterine incision. The peritoneum was grasped and closed with 2-0 Vicryl in a running fashion. The cut muscle edges and the underside of the fascia were inspected and found to be hemostatic. The fascia was closed with 0 Vicryl in two halves. A piece of interceed was placed under the fascia . The subcutaneous tissue was irrigated. Scarpa's layer was closed with a 2-0 plain gut suture. Some adhesions of the sub-cutaneous tissue were released with bovie cautery in order to allow for a better skin closure. The skin was closed with a 4-0 Monocryl in a single layer. The patient tolerated the procedure well. Sponge lap and needle counts were correct x3 and patient was taken to the recovery room in a stable condition.  Reginia Battie A. 12/12/2014 7:54 PM

## 2014-12-12 NOTE — Progress Notes (Signed)
Patient moving legs upon arrival to the PACU. No complaints of pain. Epidural catheter removed.

## 2014-12-13 ENCOUNTER — Encounter (HOSPITAL_COMMUNITY): Payer: Self-pay | Admitting: Obstetrics and Gynecology

## 2014-12-13 DIAGNOSIS — Z98891 History of uterine scar from previous surgery: Secondary | ICD-10-CM

## 2014-12-13 LAB — CBC
HCT: 28.4 % — ABNORMAL LOW (ref 36.0–46.0)
Hemoglobin: 9.7 g/dL — ABNORMAL LOW (ref 12.0–15.0)
MCH: 30.5 pg (ref 26.0–34.0)
MCHC: 34.2 g/dL (ref 30.0–36.0)
MCV: 89.3 fL (ref 78.0–100.0)
Platelets: 127 10*3/uL — ABNORMAL LOW (ref 150–400)
RBC: 3.18 MIL/uL — ABNORMAL LOW (ref 3.87–5.11)
RDW: 13.7 % (ref 11.5–15.5)
WBC: 16.1 10*3/uL — ABNORMAL HIGH (ref 4.0–10.5)

## 2014-12-13 LAB — RPR: RPR Ser Ql: NONREACTIVE

## 2014-12-13 MED ORDER — POLYSACCHARIDE IRON COMPLEX 150 MG PO CAPS
150.0000 mg | ORAL_CAPSULE | Freq: Two times a day (BID) | ORAL | Status: DC
  Administered 2014-12-13 – 2014-12-15 (×5): 150 mg via ORAL
  Filled 2014-12-13 (×5): qty 1

## 2014-12-13 MED ORDER — POLYSACCHARIDE IRON COMPLEX 150 MG PO CAPS: 150.0000 mg | ORAL_CAPSULE | Freq: Every day | ORAL | Status: DC

## 2014-12-13 MED ORDER — MAGNESIUM 200 MG PO TABS
200.0000 mg | ORAL_TABLET | Freq: Every day | ORAL | Status: DC
  Administered 2014-12-13 – 2014-12-15 (×3): 200 mg via ORAL
  Filled 2014-12-13 (×4): qty 1

## 2014-12-13 NOTE — Progress Notes (Signed)
Patient ID: Kaitlyn Daniels, female   DOB: 1981/08/31, 34 y.o.   MRN: [id number] Subjective: POD# 1 Information for the patient's newborn:  Kaitlyn, Daniels [[id number]]  female  / circ planning  Reports feeling well Feeding: breast Patient reports tolerating PO.  Breast symptoms: none - supplementing with formula Pain controlled with ibuprofen (OTC) and narcotic analgesics including Percocet Denies HA/SOB/C/P/N/V/dizziness. Flatus absent. No BM. She reports vaginal bleeding as normal, without clots.  She is ambulating, urinating without difficult.     Objective:   VS:  Filed Vitals:   12/13/14 0115 12/13/14 0125 12/13/14 0400 12/13/14 0800  BP: 92/53 92/50 93/47  98/58  Pulse: 75 76 64 62  Temp:   98.3 F (36.8 C) 97.6 F (36.4 C)  TempSrc:   Oral Oral  Resp: 18 18 18 18   Height:      Weight:      SpO2:   97%      Intake/Output Summary (Last 24 hours) at 12/13/14 1005 Last data filed at 12/13/14 9163  Gross per 24 hour  Intake 4547.08 ml  Output   3000 ml  Net 1547.08 ml        Recent Labs  12/12/14 1535 12/13/14 0545  WBC 15.3* 16.1*  HGB 11.5* 9.7*  HCT 33.3* 28.4*  PLT 157 127*     Blood type: A POS (04/09 1535)  Rubella:  Immune    Physical Exam:  General: alert, cooperative and no distress CV: Regular rate and rhythm, S1S2 present or without murmur or extra heart sounds Resp: clear Abdomen: soft, nontender, normal bowel sounds Incision: Pressure dressing loosely attached - not applying any pressure to incision / Tegaderma and Honeycomb intact - small amount of dried serosanguineous drainage on LT side of Honeycomb Uterine Fundus: firm, 1 FB below umbilicus, nontender Lochia: minimal Ext: extremities normal, atraumatic, no cyanosis or edema, Homans sign is negative, no sign of DVT and no edema, redness or tenderness in the calves or thighs   Assessment/Plan: 34 y.o.   POD# 1.  s/p Cesarean Delivery.  Indications: Repeat / PPROM                  Principal Problem:   Postpartum care following repeat cesarean delivery with BTL (4/9) Active Problems:   Preterm premature rupture of membranes in third trimester   Status post repeat low transverse cesarean section  Doing well, stable.               Regular diet as tolerated D/C foley per unit protocol D/C IV per unit protocol Ambulate Routine post-op care Desires early discharge home tomorrow  Laury Deep, M, MSN, CNM 12/13/2014, 10:05 AM

## 2014-12-13 NOTE — Progress Notes (Signed)
Acknowledged order for social work consult regarding mother's hx of anxiety  Referral is screened out by Clinical Social Worker because none of the following criteria appear to apply: -History of anxiety/depression during this pregnancy, or of post-partum depression. - Diagnosis of anxiety and/or depression within last 3 years or -MOB's symptoms are currently being treated with medication and/or therapy.  CSW completed chart review and is screening out referral at this time.    CSW consulted with MOB's RN who stated that the MOB is doing well and does not present with any symptoms at this time.  Met briefly with MOB and she reported hx of anxiety about 20 years ago while in college.  Informed that medication was never needed.     No acute social concerns related at this time.  Please contact the Clinical Social Worker if needs arise or upon MOB request.   

## 2014-12-13 NOTE — Addendum Note (Signed)
Addendum  created 12/13/14 1035 by Flossie Dibble, CRNA   Modules edited: Notes Section   Notes Section:  File: [id number]

## 2014-12-13 NOTE — Anesthesia Postprocedure Evaluation (Signed)
Anesthesia Post Note  Patient: Kaitlyn Daniels  Procedure(s) Performed: Procedure(s) (LRB): CESAREAN SECTION WITH BILATERAL TUBAL LIGATION (N/A)  Anesthesia type: Spinal/CSE  Patient location: Mother/Baby  Post pain: Pain level controlled  Post assessment: Post-op Vital signs reviewed  Last Vitals:  Filed Vitals:   12/13/14 0800  BP: 98/58  Pulse: 62  Temp: 36.4 C  Resp: 18    Post vital signs: Reviewed  Level of consciousness: awake  Complications: No apparent anesthesia complications

## 2014-12-14 ENCOUNTER — Encounter (HOSPITAL_COMMUNITY): Payer: Self-pay | Admitting: Obstetrics

## 2014-12-14 LAB — BIRTH TISSUE RECOVERY COLLECTION (PLACENTA DONATION)

## 2014-12-14 NOTE — Progress Notes (Signed)
POSTOPERATIVE DAY # 2 S/P CS  S:         Reports feeling well - newborn not going home today             Tolerating po intake / no nausea / no vomiting / + flatus / no BM / some gas pain             Bleeding is light             Pain controlled with motrin and percocet             Up ad lib / ambulatory/ voiding QS  Newborn breast feeding  / Circumcision planned today  O:  VS: BP 100/56 mmHg  Pulse 63  Temp(Src) 98.6 F (37 C) (Oral)  Resp 18  Ht 5\' 3"  (1.6 m)  Wt 77.837 kg (171 lb 9.6 oz)  BMI 30.41 kg/m2  SpO2 98%   LABS:               Recent Labs  12/12/14 1535 12/13/14 0545  WBC 15.3* 16.1*  HGB 11.5* 9.7*  PLT 157 127*               Bloodtype: --/--/A POS (04/09 1535)  Rubella:   Immune                                 Physical Exam:             Alert and Oriented X3  Abdomen: soft, non-tender, non-distended             Fundus: firm, non-tender, U-even             Dressing intact honeycomb              Incision:  approximated with suture / no erythema /no ecchymosis / small marked drainage far left lateral  Extremities: no edema, no calf pain or tenderness  A:        POD # 2 S/P CS            Mild ABL anemia - stable  P:        Routine postoperative care              Dc tomorrow      Artelia Laroche CNM, MSN, [facility] 12/14/2014, 12:48 PM

## 2014-12-14 NOTE — Lactation Note (Signed)
This note was copied from the chart of [place]. Lactation Consultation Note  Patient Name: Kaitlyn Daniels [id number] Date: 12/14/2014 Reason for consult: Follow-up assessment;Infant < 6lbs;Late preterm infant Baby is very sleepy, Mom reports he will not stay awake at the breast. She is supplementing with EBM/formula but reports it takes him sometimes an hour to feed. When he has difficulty taking the bottle she is using a curved tipped syringe to supplement.  Demonstrated waking techniques, Mom is experienced BF and is aware of techniques. Baby would get on the breast and take few non-nutritive suckles. On LC finger baby has very poor suck, weak suck. Lots of stimulation needed and still he will not sustain a suckling pattern. Mom had pumped approx 6 ml of colostrum. Set up 5 fr feeding tube at the breast to see if the stimulation would encourage him to suckle. He did latch and take a few more nutritive suckles but would not stay awake. Woke baby again and tried to finger feed using 88fr feeding tube, he took approx another 1 ml of colostrum. Baby has high palate and lots of stimulation needed to elicit a sucking reflex by stimulating the upper palate. Tried #20 nipple shield but baby developed hiccups and would not latch, again was asleep. After hiccups resolved Mom gave the remaining expressed colostrum in a bottle and was going to give enough formula for baby to have 20 ml this feeding.  Plan discussed with Mom since feedings are taking so long.  Advised to pump and bottle feed till baby is more awake to take the breast. Advised Mom would like feeding to not take longer than 30 minutes so baby would not burn more calories than he is taking in. Pump breasts every 3 hours for 15-20 minutes to encourage milk production and to have EBM to supplement.  Reviewed suck training with using a bottle to help baby develop a better suckling pattern. By hours of age, encouraged Mom to give baby 20 ml of  EBM/formula.  Encouraged to call for assist as needed with feedings.   Maternal Data    Feeding Feeding Type: Breast Milk Length of feed: 5 min  LATCH Score/Interventions Latch: Repeated attempts needed to sustain latch, nipple held in mouth throughout feeding, stimulation needed to elicit sucking reflex. Intervention(s): Adjust position;Assist with latch;Breast massage;Breast compression  Audible Swallowing: None  Type of Nipple: Everted at rest and after stimulation  Comfort (Breast/Nipple): Soft / non-tender     Hold (Positioning): Assistance needed to correctly position infant at breast and maintain latch. Intervention(s): Support Pillows;Position options;Skin to skin  LATCH Score: 6  Lactation Tools Discussed/Used Tools: 44F feeding tube / Syringe;Pump;Nipple Shields Nipple shield size: 20 Breast pump type: Double-Electric Breast Pump   Consult Status Consult Status: Follow-up Date: 12/15/14 Follow-up type: In-patient    Katrine Coho 12/14/2014, 8:17 PM

## 2014-12-14 NOTE — Lactation Note (Signed)
This note was copied from the chart of Kaitlyn Daniels. Lactation Consultation Note Experienced BF mom. Baby 8% weight loss. Lack of interest at breast LPI. Looks jaundice. Doesn't have interest in bottle feeding. W/gloved finger, attempted to stimulate to suck and wouldn't. Attempted to put to breast in football position and cried, gagged.  Mom stated she syring fed formula over an hour 56ml. Reviewed LPI feeding supplement sheet and behavior.  Noted when baby cried has upper and lower frenulum. Tongue curls and has tip of slight heart on end. Reported to nursery and Therapist, sports. Encouraged mom to wake baby to fed every 2-3 hours, close monitoring on I&O. Lots of STS. Patient Name: [name] [id number] Date: 12/14/2014 Reason for consult: Follow-up assessment;Infant weight loss;Difficult latch   Maternal Data    Feeding Feeding Type: Formula Length of feed: 2 min  LATCH Score/Interventions Latch: Too sleepy or reluctant, no latch achieved, no sucking elicited. Intervention(s): Skin to skin;Teach feeding cues;Waking techniques Intervention(s): Adjust position;Assist with latch;Breast massage;Breast compression  Audible Swallowing: None Intervention(s): Skin to skin;Hand expression Intervention(s): Skin to skin;Hand expression;Alternate breast massage  Type of Nipple: Everted at rest and after stimulation  Comfort (Breast/Nipple): Soft / non-tender     Hold (Positioning): Assistance needed to correctly position infant at breast and maintain latch. Intervention(s): Breastfeeding basics reviewed;Support Pillows;Position options;Skin to skin  LATCH Score: 5  Lactation Tools Discussed/Used Tools: Pump Breast pump type: Double-Electric Breast Pump   Consult Status Consult Status: Follow-up Date: 12/14/14 Follow-up type: In-patient    Theodoro Kalata 12/14/2014, 7:29 AM

## 2014-12-15 MED ORDER — IBUPROFEN 600 MG PO TABS: 600.0000 mg | ORAL_TABLET | Freq: Four times a day (QID) | ORAL | Status: AC

## 2014-12-15 MED ORDER — POLYSACCHARIDE IRON COMPLEX 150 MG PO CAPS: 150.0000 mg | ORAL_CAPSULE | Freq: Two times a day (BID) | ORAL | Status: AC

## 2014-12-15 MED ORDER — MAGNESIUM 200 MG PO TABS: 200.0000 mg | ORAL_TABLET | Freq: Every day | ORAL | Status: AC

## 2014-12-15 MED ORDER — OXYCODONE-ACETAMINOPHEN 5-325 MG PO TABS: 1.0000 | ORAL_TABLET | ORAL | Status: AC | PRN

## 2014-12-15 NOTE — Discharge Instructions (Signed)
Breast Pumping Tips °If you are breastfeeding, there may be times when you cannot feed your baby directly. Returning to work or going on a trip are common examples. Pumping allows you to store breast milk and feed it to your baby later.  °You may not get much milk when you first start to pump. Your breasts should start to make more after a few days. If you pump at the times you usually feed your baby, you may be able to keep making enough milk to feed your baby without also using formula. The more often you pump, the more milk you will produce.  °WHEN SHOULD I PUMP?  °· You can begin to pump soon after delivery. However, some experts recommend waiting about 4 weeks before giving your infant a bottle to make sure breastfeeding is going well.  °· If you plan to return to work, begin pumping a few weeks before. This will help you develop techniques that work best for you. It also lets you build up a supply of breast milk.   °· When you are with your infant, feed on demand and pump after each feeding.   °· When you are away from your infant for several hours, pump for about 15 minutes every 2-3 hours. Pump both breasts at the same time if you can.   °· If your infant has a formula feeding, make sure to pump around the same time.     °· If you drink any alcohol, wait 2 hours before pumping.   °HOW DO I PREPARE TO PUMP? °Your let-down reflex is the natural reaction to stimulation that makes your breast milk flow. It is easier to stimulate this reflex when you are relaxed. Find relaxation techniques that work for you. If you have difficulty with your let-down reflex, try these methods:  °· Smell one of your infant's blankets or an item of clothing.   °· Look at a picture or video of your infant.   °· Sit in a quiet, private space.   °· Massage the breast you plan to pump.   °· Place soothing warmth on the breast.   °· Play relaxing music.   °WHAT ARE SOME GENERAL BREAST PUMPING TIPS? °· Wash your hands before you pump. You  do not need to wash your nipples or breasts. °· There are three ways to pump. °¨ You can use your hand to massage and compress your breast. °¨ You can use a handheld manual pump. °¨ You can use an electric pump.   °· Make sure the suction cup (flange) on the breast pump is the right size. Place the flange directly over the nipple. If it is the wrong size or placed the wrong way, it may be painful and cause nipple damage.   °· If pumping is uncomfortable, apply a small amount of purified or modified lanolin to your nipple and areola. °· If you are using an electric pump, adjust the speed and suction power to be more comfortable. °· If pumping is painful or if you are not getting very much milk, you may need a different type of pump. A lactation consultant can help you determine what type of pump to use.   °· Keep a full water bottle near you at all times. Drinking lots of fluid helps you make more milk.  °· You can store your milk to use later. Pumped breast milk can be stored in a sealable, sterile container or plastic bag. Label all stored breast milk with the date you pumped it. °¨ Milk can stay out at room temperature for up to 8 hours. °¨   You can store your milk in the refrigerator for up to 8 days. °¨ You can store your milk in the freezer for 3 months. Thaw frozen milk using warm water. Do not put it in the microwave. °· Do not smoke. Smoking can lower your milk supply and harm your infant. If you need help quitting, ask your health care provider to recommend a program.   °WHEN SHOULD I CALL MY HEALTH CARE PROVIDER OR A LACTATION CONSULTANT? °· You are having trouble pumping. °· You are concerned that you are not making enough milk. °· You have nipple pain, soreness, or redness. °· You want to use birth control. Birth control pills may lower your milk supply. Talk to your health care provider about your options. °Document Released: 02/08/2010 Document Revised: 08/26/2013 Document Reviewed:  06/13/2013 °[employer]® Patient Information ©2015 [employer]. This information is not intended to replace advice given to you by your health care provider. Make sure you discuss any questions you have with your health care provider. ° °Nutrition for the New Mother  °A new mother needs good health and nutrition so she can have energy to take care of a new baby. Whether a mother breastfeeds or formula feeds the baby, it is important to have a well-balanced diet. Foods from all the food groups should be chosen to meet the new mother's energy needs and to give her the nutrients needed for repair and healing.  °A HEALTHY EATING PLAN °The My Pyramid plan for Moms outlines what you should eat to help you and your baby stay healthy. The energy and amount of food you need depends on whether or not you are breastfeeding. If you are breastfeeding you will need more nutrients. If you choose not to breastfeed, your nutrition goal should be to return to a healthy weight. Limiting calories may be needed if you are not breastfeeding.  °HOME CARE INSTRUCTIONS  °· For a personal plan based on your unique needs, see your Registered Dietitian or visit www.mypyramid.gov. °· Eat a variety of foods. The plan below will help guide you. The following chart has a suggested daily meal plan from the My Pyramid for Moms. °· Eat a variety of fruits and vegetables. °· Eat more dark green and orange vegetables and cooked dried beans. °· Make half your grains whole grains. Choose whole instead of refined grains. °· Choose low-fat or lean meats and poultry. °· Choose low-fat or fat-free dairy products like milk, cheese, or yogurt. °Fruits °· Breastfeeding: 2 cups °· Non-Breastfeeding: 2 cups °· What Counts as a serving? °¨ 1 cup of fruit or juice. °¨ ½ cup dried fruit. °Vegetables °· Breastfeeding: 3 cups °· Non-Breastfeeding: 2 ½ cups °· What Counts as a serving? °¨ 1 cup raw or cooked vegetables. °¨ Juice or 2 cups raw leafy  vegetables. °Grains °· Breastfeeding: 8 oz °· Non-Breastfeeding: 6 oz °· What Counts as a serving? °¨ 1 slice bread. °¨ 1 oz ready-to-eat cereal. °¨ ½ cup cooked pasta, rice, or cereal. °Meat and Beans °· Breastfeeding: 6 ½ oz °· Non-Breastfeeding: 5 ½ oz °· What Counts as a serving? °¨ 1 oz lean meat, poultry, or fish °¨ ¼ cup cooked dry beans °¨ ½ oz nuts or 1 egg °¨ 1 tbs peanut butter °Milk °· Breastfeeding: 3 cups °· Non-Breastfeeding: 3 cups °· What Counts as a serving? °¨ 1 cup milk. °¨ 8 oz yogurt. °¨ 1 ½ oz cheese. °¨ 2 oz processed cheese. °TIPS FOR THE BREASTFEEDING MOM °· Rapid weight   loss is not suggested when you are breastfeeding. By simply breastfeeding, you will be able to lose the weight gained during your pregnancy. Your caregiver can keep track of your weight and tell you if your weight loss is appropriate.  Be sure to drink fluids. You may notice that you are thirstier than usual. A suggestion is to drink a glass of water or other beverage whenever you breastfeed.  Avoid alcohol as it can be passed into your breast milk.  Limit caffeine drinks to no more than 2 to 3 cups per day.  You may need to keep taking your prenatal vitamin while you are breastfeeding. Talk with your caregiver about taking a vitamin or supplement. RETURING TO A HEALTHY WEIGHT  The My Pyramid Plan for Moms will help you return to a healthy weight. It will also provide the nutrients you need.  You may need to limit "empty" calories. These include:  High fat foods like fried foods, fatty meats, fast food, butter, and mayonnaise.  High sugar foods like sodas, jelly, candy, and sweets.  Be physically active. Include 30 minutes of exercise or more each day. Choose an activity you like such as walking, swimming, biking, or aerobics. Check with your caregiver before you start to exercise. Document Released: 11/28/2007 Document Revised: 11/13/2011 Document Reviewed: 11/28/2007 [facility] Patient Information  2015 [place], [place]. This information is not intended to replace advice given to you by your health care provider. Make sure you discuss any questions you have with your health care provider. Postpartum Depression and Baby Blues The postpartum period begins right after the birth of a baby. During this time, there is often a great amount of joy and excitement. It is also a time of many changes in the life of the parents. Regardless of how many times a mother gives birth, each child brings new challenges and dynamics to the family. It is not unusual to have feelings of excitement along with confusing shifts in moods, emotions, and thoughts. All mothers are at risk of developing postpartum depression or the "baby blues." These mood changes can occur right after giving birth, or they may occur many months after giving birth. The baby blues or postpartum depression can be mild or severe. Additionally, postpartum depression can go away rather quickly, or it can be a long-term condition.  CAUSES Raised hormone levels and the rapid drop in those levels are thought to be a main cause of postpartum depression and the baby blues. A number of hormones change during and after pregnancy. Estrogen and progesterone usually decrease right after the delivery of your baby. The levels of thyroid hormone and various cortisol steroids also rapidly drop. Other factors that play a role in these mood changes include major life events and genetics.  RISK FACTORS If you have any of the following risks for the baby blues or postpartum depression, know what symptoms to watch out for during the postpartum period. Risk factors that may increase the likelihood of getting the baby blues or postpartum depression include:  Having a personal or family history of depression.   Having depression while being pregnant.   Having premenstrual mood issues or mood issues related to oral contraceptives.  Having a lot of life stress.   Having  marital conflict.   Lacking a social support network.   Having a baby with special needs.   Having health problems, such as diabetes.  SIGNS AND SYMPTOMS Symptoms of baby blues include:  Brief changes in mood, such as going  from extreme happiness to sadness.  Decreased concentration.   Difficulty sleeping.   Crying spells, tearfulness.   Irritability.   Anxiety.  Symptoms of postpartum depression typically begin within the first month after giving birth. These symptoms include:  Difficulty sleeping or excessive sleepiness.   Marked weight loss.   Agitation.   Feelings of worthlessness.   Lack of interest in activity or food.  Postpartum psychosis is a very serious condition and can be dangerous. Fortunately, it is rare. Displaying any of the following symptoms is cause for immediate medical attention. Symptoms of postpartum psychosis include:   Hallucinations and delusions.   Bizarre or disorganized behavior.   Confusion or disorientation.  DIAGNOSIS  A diagnosis is made by an evaluation of your symptoms. There are no medical or lab tests that lead to a diagnosis, but there are various questionnaires that a health care provider may use to identify those with the baby blues, postpartum depression, or psychosis. Often, a screening tool called the [place] Postnatal Depression Scale is used to diagnose depression in the postpartum period.  TREATMENT The baby blues usually goes away on its own in 1-2 weeks. Social support is often all that is needed. You will be encouraged to get adequate sleep and rest. Occasionally, you may be given medicines to help you sleep.  Postpartum depression requires treatment because it can last several months or longer if it is not treated. Treatment may include individual or group therapy, medicine, or both to address any social, physiological, and psychological factors that may play a role in the depression. Regular exercise, a  healthy diet, rest, and social support may also be strongly recommended.  Postpartum psychosis is more serious and needs treatment right away. Hospitalization is often needed. HOME CARE INSTRUCTIONS  Get as much rest as you can. Nap when the baby sleeps.   Exercise regularly. Some women find yoga and walking to be beneficial.   Eat a balanced and nourishing diet.   Do little things that you enjoy. Have a cup of tea, take a bubble bath, read your favorite magazine, or listen to your favorite music.  Avoid alcohol.   Ask for help with household chores, cooking, grocery shopping, or running errands as needed. Do not try to do everything.   Talk to people close to you about how you are feeling. Get support from your partner, family members, friends, or other new moms.  Try to stay positive in how you think. Think about the things you are grateful for.   Do not spend a lot of time alone.   Only take over-the-counter or prescription medicine as directed by your health care provider.  Keep all your postpartum appointments.   Let your health care provider know if you have any concerns.  SEEK MEDICAL CARE IF: You are having a reaction to or problems with your medicine. SEEK IMMEDIATE MEDICAL CARE IF:  You have suicidal feelings.   You think you may harm the baby or someone else. MAKE SURE YOU:  Understand these instructions.  Will watch your condition.  Will get help right away if you are not doing well or get worse. Document Released: 05/25/2004 Document Revised: 08/26/2013 Document Reviewed: 06/02/2013 [facility] Patient Information 2015 [place], [place]. This information is not intended to replace advice given to you by your health care provider. Make sure you discuss any questions you have with your health care provider. Breastfeeding and Mastitis Mastitis is inflammation of the breast tissue. It can occur in women who  are breastfeeding. This can make breastfeeding  painful. Mastitis will sometimes go away on its own. Your health care provider will help determine if treatment is needed. CAUSES Mastitis is often associated with a blocked milk (lactiferous) duct. This can happen when too much milk builds up in the breast. Causes of excess milk in the breast can include:  Poor latch-on. If your baby is not latched onto the breast properly, she or he may not empty your breast completely while breastfeeding.  Allowing too much time to pass between feedings.  Wearing a bra or other clothing that is too tight. This puts extra pressure on the lactiferous ducts so milk does not flow through them as it should. Mastitis can also be caused by a bacterial infection. Bacteria may enter the breast tissue through cuts or openings in the skin. In women who are breastfeeding, this may occur because of cracked or irritated skin. Cracks in the skin are often caused when your baby does not latch on properly to the breast. SIGNS AND SYMPTOMS  Swelling, redness, tenderness, and pain in an area of the breast.  Swelling of the glands under the arm on the same side.  Fever may or may not accompany mastitis. If an infection is allowed to progress, a collection of pus (abscess) may develop. DIAGNOSIS  Your health care provider can usually diagnose mastitis based on your symptoms and a physical exam. Tests may be done to help confirm the diagnosis. These may include:  Removal of pus from the breast by applying pressure to the area. This pus can be examined in the lab to determine which bacteria are present. If an abscess has developed, the fluid in the abscess can be removed with a needle. This can also be used to confirm the diagnosis and determine the bacteria present. In most cases, pus will not be present.  Blood tests to determine if your body is fighting a bacterial infection.  Mammogram or ultrasound tests to rule out other problems or diseases. TREATMENT  Mastitis that  occurs with breastfeeding will sometimes go away on its own. Your health care provider may choose to wait 24 hours after first seeing you to decide whether a prescription medicine is needed. If your symptoms are worse after 24 hours, your health care provider will likely prescribe an antibiotic medicine to treat the mastitis. He or she will determine which bacteria are most likely causing the infection and will then select an appropriate antibiotic medicine. This is sometimes changed based on the results of tests performed to identify the bacteria, or if there is no response to the antibiotic medicine selected. Antibiotic medicines are usually given by mouth. You may also be given medicine for pain. HOME CARE INSTRUCTIONS  Only take over-the-counter or prescription medicines for pain, fever, or discomfort as directed by your health care provider.  If your health care provider prescribed an antibiotic medicine, take the medicine as directed. Make sure you finish it even if you start to feel better.  Do not wear a tight or underwire bra. Wear a soft, supportive bra.  Increase your fluid intake, especially if you have a fever.  Continue to empty the breast. Your health care provider can tell you whether this milk is safe for your infant or needs to be thrown out. You may be told to stop nursing until your health care provider thinks it is safe for your baby. Use a breast pump if you are advised to stop nursing.  Keep your nipples  clean and dry.  Empty the first breast completely before going to the other breast. If your baby is not emptying your breasts completely for some reason, use a breast pump to empty your breasts.  If you go back to work, pump your breasts while at work to stay in time with your nursing schedule.  Avoid allowing your breasts to become overly filled with milk (engorged). SEEK MEDICAL CARE IF:  You have pus-like discharge from the breast.  Your symptoms do not improve with  the treatment prescribed by your health care provider within 2 days. SEEK IMMEDIATE MEDICAL CARE IF:  Your pain and swelling are getting worse.  You have pain that is not controlled with medicine.  You have a red line extending from the breast toward your armpit.  You have a fever or persistent symptoms for more than 2-3 days.  You have a fever and your symptoms suddenly get worse. MAKE SURE YOU:   Understand these instructions.  Will watch your condition.  Will get help right away if you are not doing well or get worse. Document Released: 12/16/2004 Document Revised: 08/26/2013 Document Reviewed: 03/27/2013 [facility] Patient Information 2015 [place], [place]. This information is not intended to replace advice given to you by your health care provider. Make sure you discuss any questions you have with your health care provider. Breastfeeding Deciding to breastfeed is one of the best choices you can make for you and your baby. A change in hormones during pregnancy causes your breast tissue to grow and increases the number and size of your milk ducts. These hormones also allow proteins, sugars, and fats from your blood supply to make breast milk in your milk-producing glands. Hormones prevent breast milk from being released before your baby is born as well as prompt milk flow after birth. Once breastfeeding has begun, thoughts of your baby, as well as his or her sucking or crying, can stimulate the release of milk from your milk-producing glands.  BENEFITS OF BREASTFEEDING For Your Baby  Your first milk (colostrum) helps your baby's digestive system function better.   There are antibodies in your milk that help your baby fight off infections.   Your baby has a lower incidence of asthma, allergies, and sudden infant death syndrome.   The nutrients in breast milk are better for your baby than infant formulas and are designed uniquely for your baby's needs.   Breast milk improves your  baby's brain development.   Your baby is less likely to develop other conditions, such as childhood obesity, asthma, or type 2 diabetes mellitus.  For You   Breastfeeding helps to create a very special bond between you and your baby.   Breastfeeding is convenient. Breast milk is always available at the correct temperature and costs nothing.   Breastfeeding helps to burn calories and helps you lose the weight gained during pregnancy.   Breastfeeding makes your uterus contract to its prepregnancy size faster and slows bleeding (lochia) after you give birth.   Breastfeeding helps to lower your risk of developing type 2 diabetes mellitus, osteoporosis, and breast or ovarian cancer later in life. SIGNS THAT YOUR BABY IS HUNGRY Early Signs of Hunger  Increased alertness or activity.  Stretching.  Movement of the head from side to side.  Movement of the head and opening of the mouth when the corner of the mouth or cheek is stroked (rooting).  Increased sucking sounds, smacking lips, cooing, sighing, or squeaking.  Hand-to-mouth movements.  Increased sucking of  fingers or hands. Late Signs of Hunger  Fussing.  Intermittent crying. Extreme Signs of Hunger Signs of extreme hunger will require calming and consoling before your baby will be able to breastfeed successfully. Do not wait for the following signs of extreme hunger to occur before you initiate breastfeeding:   Restlessness.  A loud, strong cry.   Screaming. BREASTFEEDING BASICS Breastfeeding Initiation  Find a comfortable place to sit or lie down, with your neck and back well supported.  Place a pillow or rolled up blanket under your baby to bring him or her to the level of your breast (if you are seated). Nursing pillows are specially designed to help support your arms and your baby while you breastfeed.  Make sure that your baby's abdomen is facing your abdomen.   Gently massage your breast. With your  fingertips, massage from your chest wall toward your nipple in a circular motion. This encourages milk flow. You may need to continue this action during the feeding if your milk flows slowly.  Support your breast with 4 fingers underneath and your thumb above your nipple. Make sure your fingers are well away from your nipple and your baby's mouth.   Stroke your baby's lips gently with your finger or nipple.   When your baby's mouth is open wide enough, quickly bring your baby to your breast, placing your entire nipple and as much of the colored area around your nipple (areola) as possible into your baby's mouth.   More areola should be visible above your baby's upper lip than below the lower lip.   Your baby's tongue should be between his or her lower gum and your breast.   Ensure that your baby's mouth is correctly positioned around your nipple (latched). Your baby's lips should create a seal on your breast and be turned out (everted).  It is common for your baby to suck about 2-3 minutes in order to start the flow of breast milk. Latching Teaching your baby how to latch on to your breast properly is very important. An improper latch can cause nipple pain and decreased milk supply for you and poor weight gain in your baby. Also, if your baby is not latched onto your nipple properly, he or she may swallow some air during feeding. This can make your baby fussy. Burping your baby when you switch breasts during the feeding can help to get rid of the air. However, teaching your baby to latch on properly is still the best way to prevent fussiness from swallowing air while breastfeeding. Signs that your baby has successfully latched on to your nipple:    Silent tugging or silent sucking, without causing you pain.   Swallowing heard between every 3-4 sucks.    Muscle movement above and in front of his or her ears while sucking.  Signs that your baby has not successfully latched on to  nipple:   Sucking sounds or smacking sounds from your baby while breastfeeding.  Nipple pain. If you think your baby has not latched on correctly, slip your finger into the corner of your baby's mouth to break the suction and place it between your baby's gums. Attempt breastfeeding initiation again. Signs of Successful Breastfeeding Signs from your baby:   A gradual decrease in the number of sucks or complete cessation of sucking.   Falling asleep.   Relaxation of his or her body.   Retention of a small amount of milk in his or her mouth.   Letting go  of your breast by himself or herself. Signs from you:  Breasts that have increased in firmness, weight, and size 1-3 hours after feeding.   Breasts that are softer immediately after breastfeeding.  Increased milk volume, as well as a change in milk consistency and color by the fifth day of breastfeeding.   Nipples that are not sore, cracked, or bleeding. Signs That Your [employer] is Getting Enough Milk  Wetting at least 3 diapers in a 24-hour period. The urine should be clear and pale yellow by age 30 days.  At least 3 stools in a 24-hour period by age 30 days. The stool should be soft and yellow.  At least 3 stools in a 24-hour period by age 7 days. The stool should be seedy and yellow.  No loss of weight greater than 10% of birth weight during the first 28 days of age.  Average weight gain of 4-7 ounces (113-198 g) per week after age 60 days.  Consistent daily weight gain by age 80 days, without weight loss after the age of 2 weeks. After a feeding, your baby may spit up a small amount. This is common. BREASTFEEDING FREQUENCY AND DURATION Frequent feeding will help you make more milk and can prevent sore nipples and breast engorgement. Breastfeed when you feel the need to reduce the fullness of your breasts or when your baby shows signs of hunger. This is called "breastfeeding on demand." Avoid introducing a pacifier to your  baby while you are working to establish breastfeeding (the first 4-6 weeks after your baby is born). After this time you may choose to use a pacifier. Research has shown that pacifier use during the first year of a baby's life decreases the risk of sudden infant death syndrome (SIDS). Allow your baby to feed on each breast as long as he or she wants. Breastfeed until your baby is finished feeding. When your baby unlatches or falls asleep while feeding from the first breast, offer the second breast. Because newborns are often sleepy in the first few weeks of life, you may need to awaken your baby to get him or her to feed. Breastfeeding times will vary from baby to baby. However, the following rules can serve as a guide to help you ensure that your baby is properly fed:  Newborns (babies 17 weeks of age or younger) may breastfeed every 1-3 hours.  Newborns should not go longer than 3 hours during the day or 5 hours during the night without breastfeeding.  You should breastfeed your baby a minimum of 8 times in a 24-hour period until you begin to introduce solid foods to your baby at around 68 months of age. BREAST MILK PUMPING Pumping and storing breast milk allows you to ensure that your baby is exclusively fed your breast milk, even at times when you are unable to breastfeed. This is especially important if you are going back to work while you are still breastfeeding or when you are not able to be present during feedings. Your lactation consultant can give you guidelines on how long it is safe to store breast milk.  A breast pump is a machine that allows you to pump milk from your breast into a sterile bottle. The pumped breast milk can then be stored in a refrigerator or freezer. Some breast pumps are operated by hand, while others use electricity. Ask your lactation consultant which type will work best for you. Breast pumps can be purchased, but some hospitals and breastfeeding support groups  lease  breast pumps on a monthly basis. A lactation consultant can teach you how to hand express breast milk, if you prefer not to use a pump.  CARING FOR YOUR BREASTS WHILE YOU BREASTFEED Nipples can become dry, cracked, and sore while breastfeeding. The following recommendations can help keep your breasts moisturized and healthy:  Avoid using soap on your nipples.   Wear a supportive bra. Although not required, special nursing bras and tank tops are designed to allow access to your breasts for breastfeeding without taking off your entire bra or top. Avoid wearing underwire-style bras or extremely tight bras.  Air dry your nipples for 3-37minutes after each feeding.   Use only cotton bra pads to absorb leaked breast milk. Leaking of breast milk between feedings is normal.   Use lanolin on your nipples after breastfeeding. Lanolin helps to maintain your skin's normal moisture barrier. If you use pure lanolin, you do not need to wash it off before feeding your baby again. Pure lanolin is not toxic to your baby. You may also hand express a few drops of breast milk and gently massage that milk into your nipples and allow the milk to air dry. In the first few weeks after giving birth, some women experience extremely full breasts (engorgement). Engorgement can make your breasts feel heavy, warm, and tender to the touch. Engorgement peaks within 3-5 days after you give birth. The following recommendations can help ease engorgement:  Completely empty your breasts while breastfeeding or pumping. You may want to start by applying warm, moist heat (in the shower or with warm water-soaked hand towels) just before feeding or pumping. This increases circulation and helps the milk flow. If your baby does not completely empty your breasts while breastfeeding, pump any extra milk after he or she is finished.  Wear a snug bra (nursing or regular) or tank top for 1-2 days to signal your body to slightly decrease milk  production.  Apply ice packs to your breasts, unless this is too uncomfortable for you.  Make sure that your baby is latched on and positioned properly while breastfeeding. If engorgement persists after 48 hours of following these recommendations, contact your health care provider or a Science writer. OVERALL HEALTH CARE RECOMMENDATIONS WHILE BREASTFEEDING  Eat healthy foods. Alternate between meals and snacks, eating 3 of each per day. Because what you eat affects your breast milk, some of the foods may make your baby more irritable than usual. Avoid eating these foods if you are sure that they are negatively affecting your baby.  Drink milk, fruit juice, and water to satisfy your thirst (about 10 glasses a day).   Rest often, relax, and continue to take your prenatal vitamins to prevent fatigue, stress, and anemia.  Continue breast self-awareness checks.  Avoid chewing and smoking tobacco.  Avoid alcohol and drug use. Some medicines that may be harmful to your baby can pass through breast milk. It is important to ask your health care provider before taking any medicine, including all over-the-counter and prescription medicine as well as vitamin and herbal supplements. It is possible to become pregnant while breastfeeding. If birth control is desired, ask your health care provider about options that will be safe for your baby. SEEK MEDICAL CARE IF:   You feel like you want to stop breastfeeding or have become frustrated with breastfeeding.  You have painful breasts or nipples.  Your nipples are cracked or bleeding.  Your breasts are red, tender, or warm.  You have  a swollen area on either breast.  You have a fever or chills.  You have nausea or vomiting.  You have drainage other than breast milk from your nipples.  Your breasts do not become full before feedings by the fifth day after you give birth.  You feel sad and depressed.  Your baby is too sleepy to eat  well.  Your baby is having trouble sleeping.   Your baby is wetting less than 3 diapers in a 24-hour period.  Your baby has less than 3 stools in a 24-hour period.  Your baby's skin or the white part of his or her eyes becomes yellow.   Your baby is not gaining weight by 54 days of age. SEEK IMMEDIATE MEDICAL CARE IF:   Your baby is overly tired (lethargic) and does not want to wake up and feed.  Your baby develops an unexplained fever. Document Released: 08/21/2005 Document Revised: 08/26/2013 Document Reviewed: 02/12/2013 [facility] Patient Information 2015 [place], [place]. This information is not intended to replace advice given to you by your health care provider. Make sure you discuss any questions you have with your health care provider.

## 2014-12-15 NOTE — Discharge Summary (Signed)
POSTOPERATIVE DISCHARGE SUMMARY:  Patient ID: Kaitlyn Daniels MRN: [id number] DOB/AGE: [birth year] 34 y.o.  Admit date: 12/12/2014 Admission Diagnoses: PPROM / Repeat Cesarean Delivery   Discharge date: 12/15/2014 Discharge Diagnoses: S/P C/S due to repeat on 12/12/2014        Prenatal history: G3P1203   EDC : 01/16/2015, by LMP  Has received prenatal care at [facility] since 9.[redacted] wks gestation. Primary provider : Dr. Pamala Hurry Prenatal course complicated by H/O Previous Cesarean deliveries / H/O PPROM @ 35 wks with previous pregnancy  Prenatal Labs: ABO, Rh: A POS (04/09 1535)  Antibody: NEG (04/09 1535) Rubella:   Immune RPR: Non Reactive (04/09 1535)  HBsAg:   Non-Reactive  HIV:   Non-Reactive GTT : Abnormal 1 hr / Normal 3 hr GBS:   Positive   Medical / Surgical History :  Past medical history:  Past Medical History  Diagnosis Date  . No pertinent past medical history   . Acute blood loss anemia 05/15/2011  . Arthritis   . Preterm premature rupture of membranes in third trimester 12/12/2014  . Postpartum care following repeat cesarean delivery with BTL (4/9) 12/13/2014    Past surgical history:  Past Surgical History  Procedure Laterality Date  . Cesarean section  05/13/2011    Procedure: CESAREAN SECTION;  Surgeon: Elveria Royals;  Location: [facility];  Service: Gynecology;  Laterality: N/A;  Primary cesarean section with delivery of baby boy at 25. Apgars 4/8.  Marland Kitchen Cesarean section N/A 04/05/2013    Procedure: Repeat cesarean section with delivery of baby boy at [facility].;  Surgeon: Elveria Royals, MD;  Location: [facility];  Service: Obstetrics;  Laterality: N/A;  . Cesarean section with bilateral tubal ligation N/A 12/12/2014    Procedure: CESAREAN SECTION WITH BILATERAL TUBAL LIGATION;  Surgeon: Aloha Gell, MD;  Location: [facility];  Service: Obstetrics;  Laterality: N/A;     Allergies: Review of patient's allergies indicates no known allergies.   Intrapartum  Course:  Admitted for unscheduled repeat cesarean delivery for PPROM and previous cesarean delivery - see operative report for further details  Physical Exam:   VSS: Blood pressure 102/78, pulse 70, temperature 98 F (36.7 C), temperature source Oral, resp. rate 18, height 5\' 3"  (1.6 m), weight 77.837 kg (171 lb 9.6 oz), SpO2 98 %, currently breastfeeding.  LABS:  Recent Labs  12/12/14 1535 12/13/14 0545  WBC 15.3* 16.1*  HGB 11.5* 9.7*  PLT 157 127*    Newborn Data Live born female on 12/12/2014 Birth Weight: 6 lb 6.7 oz (2912 g) APGAR: 8, 9  See operative report for further details  Infant remains inpatient at time of mother's discharge.  Discharge Instructions:  Wound Care: keep clean and dry / remove honeycomb POD 5 Postpartum Instructions: [facility] discharge booklet - instructions reviewed Medications:    Medication List    TAKE these medications        calcium carbonate 500 MG chewable tablet  Commonly known as:  TUMS - dosed in mg elemental calcium  Chew 1-2 tablets by mouth daily as needed for indigestion or heartburn.     ibuprofen 600 MG tablet  Commonly known as:  ADVIL,MOTRIN  Take 1 tablet (600 mg total) by mouth every 6 (six) hours.     iron polysaccharides 150 MG capsule  Commonly known as:  NIFEREX  Take 1 capsule (150 mg total) by mouth 2 (two) times daily.     Magnesium 200 MG Tabs  Take 1 tablet (  200 mg total) by mouth daily.     oxyCODONE-acetaminophen 5-325 MG per tablet  Commonly known as:  PERCOCET/ROXICET  Take 1 tablet by mouth every 4 (four) hours as needed (for pain scale 4-7).     prenatal multivitamin Tabs tablet  Take 1 tablet by mouth daily at 12 noon.           Follow-up Information    Follow up with [facility] A., MD. Schedule an appointment as soon as possible for a visit in 6 weeks.   Specialty:  Obstetrics and Gynecology   Why:  postpartum visit   Contact information:   [place]  [id number] [phone]         Signed: Graceann Congress, MSN, CNM 12/15/2014, 10:17 AM

## 2014-12-15 NOTE — Lactation Note (Signed)
This note was copied from the chart of [place]. Lactation Consultation Note: Follow up visit with mom. She reports baby is not feeding very well. Will only take a few sucks at the breast or bottle so she has been syringe feeding EBM and formula to him to get him fed. Mom getting ready to pump as I went in room. Mature milk coming in. Reviewed milk storage with mom. She is giving formula 22 cal also. Encouraged to continue trying bottle feeding so baby will learn to suck. Experienced BF mom. Has Medela pump at home. No questions at present. To call for assist prn  Patient Name: Kaitlyn Daniels KBTCY'E Date: 12/15/2014 Reason for consult: Follow-up assessment;Late preterm infant;Infant < 6lbs   Maternal Data    Feeding    LATCH Score/Interventions          Comfort (Breast/Nipple): Filling, red/small blisters or bruises, mild/mod discomfort  Problem noted: Filling        Lactation Tools Discussed/Used WIC Program: No   Consult Status Consult Status: Follow-up Date: 12/16/14 Follow-up type: In-patient    Truddie Crumble 12/15/2014, 12:31 PM

## 2014-12-15 NOTE — Progress Notes (Signed)
POSTOPERATIVE DAY # 3 S/P Rpt CS with BTL  S:         Reports feeling well - newborn not going home today             Tolerating po intake / no nausea / no vomiting / + flatus / no BM              Bleeding is light             Pain controlled with motrin and percocet             Up ad lib / ambulatory/ voiding QS  Newborn breast feeding  / Circumcision planned today  O:  VS: BP 102/78 mmHg  Pulse 70  Temp(Src) 98 F (36.7 C) (Oral)  Resp 18  Ht 5\' 3"  (1.6 m)  Wt 77.837 kg (171 lb 9.6 oz)  BMI 30.41 kg/m2  SpO2 98%  Breastfeeding   LABS:                Recent Labs  12/12/14 1535 12/13/14 0545  WBC 15.3* 16.1*  HGB 11.5* 9.7*  PLT 157 127*               Bloodtype: --/--/A POS (04/09 1535)  Rubella:   Immune                                 Physical Exam:             Alert and Oriented X3  Abdomen: soft, non-tender, non-distended             Fundus: firm, non-tender, U-3             Dressing intact honeycomb              Incision:  approximated with suture / no erythema /no ecchymosis / small marked drainage far left lateral  Extremities: no edema, no calf pain or tenderness  A:        POD # 3 S/P RCS with BTL            Mild ABL anemia - stable  P:        Routine postoperative care             D/C home today     Laury Deep, M MSN, CNM  12/15/2014, 9:37 AM

## 2014-12-16 ENCOUNTER — Ambulatory Visit: Payer: Self-pay

## 2014-12-16 NOTE — Lactation Note (Addendum)
This note was copied from the chart of [place]. Lactation Consultation Note  P3, Ex BF. 11.4% weight loss. Mother states feedings are improving. She is now pumping approx 30-35 ml. Mother seems confident about feeding plan. She states he breastfeeds for about 10 min and when sleepy gives him supplement. At this time he does best finger feeding w/ curved tip syringe but she is also trying the bottle. Suggest she continue to keep breastfeeding and trying the bottle because it can help with him sucking. Offered outpatient appt but mother states she will call.  Encouraged outpatient appt. Discussed volume guidelines.  Baby is going home on phototherapy and bili and weight will be rechecked in the am. Reviewed engorgement care and monitoring voids/stools. Suggest mother call to view next feeding.      Patient Name: Kaitlyn Daniels Date: 12/16/2014 Reason for consult: Late preterm infant;Follow-up assessment   Maternal Data    Feeding Feeding Type: Bottle Fed - Breast Milk Length of feed: 10 min  LATCH Score/Interventions                      Lactation Tools Discussed/Used     Consult Status Consult Status: Follow-up Date: 12/16/14 Follow-up type: In-patient    Kaitlyn Daniels [facility] 12/16/2014, 11:01 AM

## 2015-01-12 ENCOUNTER — Inpatient Hospital Stay (HOSPITAL_COMMUNITY): Admission: RE | Admit: 2015-01-12 | Payer: 59 | Source: Ambulatory Visit | Admitting: Obstetrics

## 2015-01-12 ENCOUNTER — Encounter (HOSPITAL_COMMUNITY): Admission: RE | Payer: Self-pay | Source: Ambulatory Visit

## 2015-01-12 SURGERY — Surgical Case
Anesthesia: Spinal

## 2022-03-04 ENCOUNTER — Other Ambulatory Visit: Payer: Self-pay

## 2022-03-04 ENCOUNTER — Emergency Department
Admission: RE | Admit: 2022-03-04 | Discharge: 2022-03-04 | Disposition: A | Discharge status: Home / Self Care | Payer: 59 | Source: Ambulatory Visit

## 2022-03-04 VITALS — BP 119/79 | HR 73 | Temp 98.5°F | Resp 17

## 2022-03-04 DIAGNOSIS — J0100 Acute maxillary sinusitis, unspecified: Secondary | ICD-10-CM

## 2022-03-04 DIAGNOSIS — R059 Cough, unspecified: Secondary | ICD-10-CM

## 2022-03-04 DIAGNOSIS — J309 Allergic rhinitis, unspecified: Secondary | ICD-10-CM

## 2022-03-04 MED ORDER — BENZONATATE 200 MG PO CAPS: 200.0000 mg | ORAL_CAPSULE | Freq: Three times a day (TID) | ORAL | 0 refills | Status: AC | PRN

## 2022-03-04 MED ORDER — FEXOFENADINE HCL 180 MG PO TABS: 180.0000 mg | ORAL_TABLET | Freq: Every day | ORAL | 0 refills | Status: AC

## 2022-03-04 MED ORDER — PREDNISONE 20 MG PO TABS: ORAL_TABLET | ORAL | 0 refills | Status: AC

## 2022-03-04 MED ORDER — AMOXICILLIN-POT CLAVULANATE 875-125 MG PO TABS: 1.0000 | ORAL_TABLET | Freq: Two times a day (BID) | ORAL | 0 refills | Status: AC

## 2022-03-04 NOTE — ED Triage Notes (Addendum)
Pt c/o cough and cold sxs x 3 weeks. Fever first three days but none in last 2.5 weeks. Tuesday worsening sore throat. Ibuprofen prn.

## 2022-03-04 NOTE — ED Provider Notes (Signed)
Kaitlyn Daniels [facility]    CSN: [id number] Arrival date & time: 03/04/22  6761      History   Chief Complaint Chief Complaint  Patient presents with   Cough   Sore Throat    HPI Kaitlyn Daniels is a 41 y.o. female.   HPI 41 year old female presents with cold and cough symptoms for 3 weeks.  PMH significant for obesity, thyroid megaly, and acute blood loss anemia during pregnancy.  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 Apgars 4/8.   CESAREAN SECTION N/A 04/05/2013   Procedure: Repeat cesarean section with delivery of baby boy at [place].;  Surgeon: Elveria Royals, MD;  Location: [facility];  Service: Obstetrics;  Laterality: N/A;   CESAREAN SECTION WITH BILATERAL TUBAL LIGATION N/A 12/12/2014   Procedure: CESAREAN SECTION WITH BILATERAL TUBAL LIGATION;  Surgeon: Aloha Gell, MD;  Location: [facility];  Service: Obstetrics;  Laterality: N/A;    OB History     Gravida  3   Para  3   Term  1   Preterm  2   AB      Living  3      SAB      IAB      Ectopic      Multiple  0   Live Births  3            Home Medications    Prior to Admission  medications   Medication Sig Start Date End Date Taking? Authorizing Provider  amoxicillin-clavulanate (AUGMENTIN) 875-125 MG tablet Take 1 tablet by mouth 2 (two) times daily for 10 days. 03/04/22 03/14/22 Yes Eliezer Lofts, FNP  benzonatate (TESSALON) 200 MG capsule Take 1 capsule (200 mg total) by mouth 3 (three) times daily as needed for up to 7 days for cough. 03/04/22 03/11/22 Yes Eliezer Lofts, FNP  fexofenadine [facility] ALLERGY) 180 MG tablet Take 1 tablet (180 mg total) by mouth daily for 15 days. 03/04/22 03/19/22 Yes Eliezer Lofts, FNP  predniSONE (DELTASONE) 20 MG tablet Take 3 tabs PO daily x 5 days. 03/04/22  Yes Eliezer Lofts, FNP  calcium carbonate (TUMS - DOSED IN MG ELEMENTAL CALCIUM) 500 MG chewable tablet Chew 1-2 tablets by mouth daily as needed for indigestion or heartburn.    [provider]  ibuprofen (ADVIL,MOTRIN) 600 MG tablet Take 1 tablet (600 mg total) by mouth every 6 (six) hours. 12/15/14   Laury Deep, CNM  iron polysaccharides (NIFEREX) 150 MG capsule Take 1 capsule (150 mg total) by mouth 2 (two) times daily. 12/15/14   Laury Deep, CNM  Magnesium 200 MG TABS Take 1 tablet (200 mg total) by mouth daily.  12/15/14   Laury Deep, CNM  oxyCODONE-acetaminophen (PERCOCET/ROXICET) 5-325 MG per tablet Take 1 tablet by mouth every 4 (four) hours as needed (for pain scale 4-7). 12/15/14   Laury Deep, CNM  Prenatal Vit-Fe Fumarate-FA (PRENATAL MULTIVITAMIN) TABS Take 1 tablet by mouth daily at 12 noon.    [provider]    Family History Family History  Problem Relation Age of Onset   Cancer Maternal Aunt    Cancer Paternal Grandfather     Social History Social History   Tobacco Use   Smoking status: Never  Substance Use Topics   Alcohol use: Yes    Comment: occasionally   Drug use: No     Allergies   Patient has no known allergies.   Review of Systems Review of Systems  HENT:  Positive for congestion, postnasal drip, sinus  pressure and sneezing.   Respiratory:  Positive for cough.   All other systems reviewed and are negative.    Physical Exam Triage Vital Signs ED Triage Vitals [03/04/22 0836]  Enc Vitals Group     BP 119/79     Pulse Rate 73     Resp 17     Temp 98.5 F (36.9 C)     Temp Source Oral     SpO2 100 %     Weight      Height      Head Circumference      Peak Flow      Pain Score 6     Pain Loc      Pain Edu?      Excl. in [place]?    No data found.  Updated Vital Signs BP 119/79 (BP Location: Left Arm)   Pulse 73   Temp 98.5 F (36.9 C) (Oral)   Resp 17   LMP 02/07/2022 (Exact Date)   SpO2 100%      Physical Exam Vitals and nursing note reviewed.  Constitutional:      General: She is not in acute distress.    Appearance: She is obese. She is not ill-appearing.  HENT:     Head: Normocephalic and atraumatic.     Right Ear: Tympanic membrane and external ear normal.     Left Ear: Tympanic membrane and external ear normal.     Ears:     Comments: Moderate eustachian tube dysfunction noted bilaterally    Nose:     Comments: Turbinates are erythematous/edematous    Mouth/Throat:     Mouth: Mucous membranes are moist.     Pharynx: Oropharynx is clear. Uvula midline.     Comments: Moderate amount of clear drainage of posterior oropharynx noted Eyes:     Conjunctiva/sclera: Conjunctivae normal.     Pupils: Pupils are equal, round, and reactive to light.  Cardiovascular:     Rate and Rhythm: Normal rate and regular rhythm.     Pulses: Normal pulses.     Heart sounds: Normal heart sounds. No murmur heard. Pulmonary:     Effort: Pulmonary effort is normal.     Breath sounds: Normal breath sounds. No wheezing, rhonchi or rales.     Comments: Infrequent nonproductive cough noted on exam Musculoskeletal:     Cervical back: Normal range of motion and neck supple.  Skin:    General: Skin is warm.  Neurological:     General: No focal deficit present.     Mental Status:  She is alert and oriented to person, place, and time.  UC Treatments / Results  Labs (all labs ordered are listed, but only abnormal results are displayed) Labs Reviewed - No data to display  EKG   Radiology No results found.  Procedures Procedures (including critical care time)  Medications Ordered in UC Medications - No data to display  Initial Impression / Assessment and Plan / UC Course  I have reviewed the triage vital signs and the nursing notes.  Pertinent labs & imaging results that were available during my care of the patient were reviewed by me and considered in my medical decision making (see chart for details).     MDM: 1.  Cough-Rx'd prednisone, Tessalon Perles; 2.  Acute maxillary sinusitis, recurrence not specified-Rx'd Augmentin; 3.  Allergic rhinitis-Rx'd Allegra. Instructed patient to take medication as directed with food to completion.  Advised patient to take prednisone and Allegra with first dose of Augmentin for the next 5 of 10 days.  Advised may use Allegra as needed afterwards for concurrent postnasal drainage/drip.  Advised may use Tessalon Perles daily or as needed for cough.  Encouraged patient to increase daily water intake while taking these medications.  Advised patient if symptoms worsen and/or unresolved please follow-up with PCP or here for further evaluation.  Final Clinical Impressions(s) / UC Diagnoses   Final diagnoses:  Cough, unspecified type  Acute maxillary sinusitis, recurrence not specified  Allergic rhinitis, unspecified seasonality, unspecified trigger     Discharge Instructions      Instructed patient to take medication as directed with food to completion.  Advised patient to take prednisone and Allegra with first dose of Augmentin for the next 5 of 10 days.  Advised may use Allegra as needed afterwards for concurrent postnasal drainage/drip.  Advised may use Tessalon Perles daily or as needed for cough.  Encouraged patient  to increase daily water intake while taking these medications.  Advised patient if symptoms worsen and/or unresolved please follow-up with PCP or here for further evaluation.     ED Prescriptions     Medication Sig Dispense Auth. Provider   amoxicillin-clavulanate (AUGMENTIN) 875-125 MG tablet Take 1 tablet by mouth 2 (two) times daily for 10 days. 20 tablet Eliezer Lofts, FNP   predniSONE (DELTASONE) 20 MG tablet Take 3 tabs PO daily x 5 days. 15 tablet Eliezer Lofts, FNP   fexofenadine [facility] ALLERGY) 180 MG tablet Take 1 tablet (180 mg total) by mouth daily for 15 days. 15 tablet Eliezer Lofts, FNP   benzonatate (TESSALON) 200 MG capsule Take 1 capsule (200 mg total) by mouth 3 (three) times daily as needed for up to 7 days for cough. 40 capsule Eliezer Lofts, FNP      PDMP not reviewed this encounter.   Eliezer Lofts, [place] 03/04/22 [phone]

## 2022-03-04 NOTE — Discharge Instructions (Addendum)
Instructed patient to take medication as directed with food to completion.  Advised patient to take prednisone and Allegra with first dose of Augmentin for the next 5 of 10 days.  Advised may use Allegra as needed afterwards for concurrent postnasal drainage/drip.  Advised may use Tessalon Perles daily or as needed for cough.  Encouraged patient to increase daily water intake while taking these medications.  Advised patient if symptoms worsen and/or unresolved please follow-up with PCP or here for further evaluation.
